# Patient Record
Sex: Male | Born: 1990 | Race: White | Hispanic: No | Marital: Single | Smoking: Never smoker
Health system: Southern US, Community
[De-identification: ages and names within clinical notes are randomized; demographics above are authoritative.]

## PROBLEM LIST (undated history)

## (undated) DIAGNOSIS — K219 Gastro-esophageal reflux disease without esophagitis: Secondary | ICD-10-CM

## (undated) HISTORY — DX: Gastro-esophageal reflux disease without esophagitis: K21.9

---
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
## 2012-01-06 ENCOUNTER — Ambulatory Visit (INDEPENDENT_AMBULATORY_CARE_PROVIDER_SITE_OTHER): Payer: PRIVATE HEALTH INSURANCE | Admitting: Physician Assistant

## 2012-01-06 VITALS — BP 94/60 | HR 63 | Temp 97.7°F | Resp 16 | Ht 69.0 in | Wt 156.0 lb

## 2012-01-06 DIAGNOSIS — Z23 Encounter for immunization: Secondary | ICD-10-CM

## 2012-01-06 NOTE — Progress Notes (Signed)
  Subjective:    Patient ID: MARTRELL EGUIA, male    DOB: 04/29/1991, 21 y.o.   MRN: [id number]  HPI 21 year old male presents for Tdap immunization.  He is transferring to [facility].  No known medical problems. He is up to date on all other immunizations and does not take any daily medications.     Review of Systems  All other systems reviewed and are negative.       Objective:   Physical Exam  Constitutional: He is oriented to person, place, and time. He appears well-developed and well-nourished.  HENT:  Head: Normocephalic and atraumatic.  Right Ear: External ear normal.  Left Ear: External ear normal.  Eyes: Conjunctivae are normal.  Neck: Normal range of motion.  Cardiovascular: Normal rate, regular rhythm and normal heart sounds.   Pulmonary/Chest: Effort normal and breath sounds normal.  Neurological: He is alert and oriented to person, place, and time.  Psychiatric: He has a normal mood and affect. His behavior is normal. Judgment and thought content normal.          Assessment & Plan:   1. Need for Tdap vaccination  Tdap vaccine greater than or equal to 7yo IM  Tdap given.  Forms completed Follow up as needed.

## 2012-06-13 ENCOUNTER — Telehealth: Payer: Self-pay

## 2012-06-13 NOTE — Telephone Encounter (Signed)
Patient had t-dap done in September. Faxed to [facility] with confirmation and faxed to his mother with confirmation (she is on [place]). Also faxed [employer] confirmation to her as well.

## 2012-06-13 NOTE — Telephone Encounter (Signed)
[facility] needs a copy of vaccines given on 01/06/12 T-Dap  To [phone]  Also fax to mom [phone]  At her desk   [employer]:  [id number]

## 2013-01-06 ENCOUNTER — Encounter (HOSPITAL_COMMUNITY): Payer: Self-pay | Admitting: *Deleted

## 2013-01-06 ENCOUNTER — Emergency Department (HOSPITAL_COMMUNITY)
Admission: EM | Admit: 2013-01-06 | Discharge: 2013-01-06 | Disposition: A | Discharge status: Home / Self Care | Payer: No Typology Code available for payment source | Attending: Emergency Medicine | Admitting: Emergency Medicine

## 2013-01-06 ENCOUNTER — Emergency Department (HOSPITAL_COMMUNITY): Payer: No Typology Code available for payment source

## 2013-01-06 DIAGNOSIS — R42 Dizziness and giddiness: Secondary | ICD-10-CM | POA: Insufficient documentation

## 2013-01-06 DIAGNOSIS — R0602 Shortness of breath: Secondary | ICD-10-CM | POA: Insufficient documentation

## 2013-01-06 DIAGNOSIS — Z880 Allergy status to penicillin: Secondary | ICD-10-CM | POA: Insufficient documentation

## 2013-01-06 DIAGNOSIS — R0789 Other chest pain: Secondary | ICD-10-CM | POA: Insufficient documentation

## 2013-01-06 DIAGNOSIS — F411 Generalized anxiety disorder: Secondary | ICD-10-CM | POA: Insufficient documentation

## 2013-01-06 LAB — POCT I-STAT TROPONIN I: Troponin i, poc: 0 ng/mL (ref 0.00–0.08)

## 2013-01-06 NOTE — ED Notes (Signed)
pts belongings placed in a belongings bag. Items include black sneakers denim shirt and jeans.

## 2013-01-06 NOTE — ED Notes (Signed)
3 weeks ago pt was worked up for chest pain/sob and told he had anxiety.   Today he is feeling the same - sob, L sided chest pain. Denies nausea, diaphoresis, but c/o "fast hr".  nsr presently.

## 2013-01-06 NOTE — ED Notes (Signed)
Pt had vagal episode with blood draw. Pt placed flat on strecther and cool cloth given. Pt diaphoretic, mother at bedside

## 2013-01-06 NOTE — ED Notes (Signed)
Pt feels better, skin is warm and dry. Iv infusing without problem.  MD aware

## 2013-01-06 NOTE — ED Provider Notes (Signed)
CSN: [id number]     Arrival date & time 01/06/13  1352 History   First MD Initiated Contact with Patient 01/06/13 1612     Chief Complaint  Patient presents with  . Chest Pain  . Anxiety   (Consider location/radiation/quality/duration/timing/severity/associated sxs/prior Treatment) HPI Complains of anterior chest pain described as "a stiffness "in his anterior chest, nonradiating onset at noon today accompanied by symptoms of lightheadedness and shortness of breath. No sweatiness no nausea. No other associated symptoms. Nothing makes symptoms better or worse however symptoms have improved spontaneously with time. Seen for chest pain approximately one month ago at an urgent care center as told he had anxiety attack. Discomfort is now minimal described as 2 on a scale of 1-10.  History reviewed. No pertinent past medical history. cardiac risk factors none History reviewed. No pertinent past surgical history. past medical history negative No family history on file. past surgical history is negative History  Substance Use Topics  . Smoking status: Never Smoker   . Smokeless tobacco: Not on file  . Alcohol Use: No    social history nonsmoker occasional on-call no illicit drug use Review of Systems  Constitutional: Negative.   HENT: Negative.   Respiratory: Positive for shortness of breath.   Cardiovascular: Positive for chest pain.  Gastrointestinal: Negative.   Musculoskeletal: Negative.   Skin: Negative.   Neurological: Positive for light-headedness.  Psychiatric/Behavioral: Negative.   All other systems reviewed and are negative.    Allergies  Penicillins and Codeine  Home Medications  No current outpatient prescriptions on file. BP 115/67  Pulse 61  Temp(Src) 98.1 F (36.7 C) (Oral)  Resp 21  Ht 5\' 8"  (1.727 m)  Wt 165 lb (74.844 kg)  BMI 25.09 kg/m2  SpO2 99% Physical Exam  Nursing note and vitals reviewed. Constitutional: He appears well-developed and  well-nourished.  HENT:  Head: Normocephalic and atraumatic.  Eyes: Conjunctivae are normal. Pupils are equal, round, and reactive to light.  Neck: Neck supple. No tracheal deviation present. No thyromegaly present.  Cardiovascular: Normal rate and regular rhythm.   No murmur heard. Pulmonary/Chest: Effort normal and breath sounds normal.  Abdominal: Soft. Bowel sounds are normal. He exhibits no distension. There is no tenderness.  Musculoskeletal: Normal range of motion. He exhibits no edema and no tenderness.  Neurological: He is alert. Coordination normal.  Skin: Skin is warm and dry. No rash noted.  Psychiatric: He has a normal mood and affect.    ED Course  Procedures (including critical care time) Labs Review Labs Reviewed - No data to display Imaging Review No results found.  Date: 01/06/2013  Rate: 65  Rhythm: normal sinus rhythm  QRS Axis: normal  Intervals: normal  ST/T Wave abnormalities: normal  Conduction Disutrbances: none  Narrative Interpretation: unremarkable  Patient had brief vasovagal episode while his blood was being drawn. Recovered spontaneously. 6:40 PM he is asymptomatic. Chest x-ray viewed by me. Troponin I equals 0. No results found for this or any previous visit. Dg Chest 2 View  01/06/2013   *RADIOLOGY REPORT*  Clinical Data: Chest pain.  CHEST - 2 VIEW  Comparison: No priors.  Findings: Lung volumes are normal.  No consolidative airspace disease.  No pleural effusions.  No pneumothorax.  No pulmonary nodule or mass noted.  Pulmonary vasculature and the cardiomediastinal silhouette are within normal limits.  IMPRESSION: 1. No radiographic evidence of acute cardiopulmonary disease.   Original Report Authenticated By: Trudie Reed, M.D.    MDM  No diagnosis  found. Strongly doubt acute coronary syndrome in this young otherwise healthy male with atypical symptoms normal EKG and negative troponin Plan referral wellness Center Diagnosis atypical  chest pain    Doug Sou, MD 01/06/13 1845

## 2013-02-26 ENCOUNTER — Ambulatory Visit (INDEPENDENT_AMBULATORY_CARE_PROVIDER_SITE_OTHER): Payer: PRIVATE HEALTH INSURANCE | Admitting: Family Medicine

## 2013-02-26 VITALS — BP 110/70 | HR 60 | Temp 98.4°F | Resp 16 | Ht 68.0 in | Wt 159.2 lb

## 2013-02-26 DIAGNOSIS — R002 Palpitations: Secondary | ICD-10-CM

## 2013-02-26 DIAGNOSIS — R0609 Other forms of dyspnea: Secondary | ICD-10-CM

## 2013-02-26 DIAGNOSIS — F411 Generalized anxiety disorder: Secondary | ICD-10-CM

## 2013-02-26 DIAGNOSIS — R0600 Dyspnea, unspecified: Secondary | ICD-10-CM

## 2013-02-26 LAB — POCT CBC
HCT, POC: 46.8 % (ref 43.5–53.7)
Hemoglobin: 15.5 g/dL (ref 14.1–18.1)
Lymph, poc: 1.9 (ref 0.6–3.4)
MCH, POC: 30.5 pg (ref 27–31.2)
MCV: 92.1 fL (ref 80–97)
MPV: 9.2 fL (ref 0–99.8)
POC MID %: 3.9 %M (ref 0–12)
RBC: 5.08 M/uL (ref 4.69–6.13)
WBC: 10.9 10*3/uL — AB (ref 4.6–10.2)

## 2013-02-26 MED ORDER — BUSPIRONE HCL 7.5 MG PO TABS: 7.5000 mg | ORAL_TABLET | Freq: Three times a day (TID) | ORAL | Status: DC

## 2013-02-26 MED ORDER — BUSPIRONE HCL 7.5 MG PO TABS: 7.5000 mg | ORAL_TABLET | Freq: Two times a day (BID) | ORAL | Status: DC

## 2013-02-26 NOTE — Patient Instructions (Signed)
Take the Buspar twice a day.  This is for anxiety and should help with any anxiety if your heart rate speeds up.  We are checking your thyroid, electrolytes, and kidney function as well as checking for anemia today.   We will also get you to see a cardiologist for a home heartrate monitor.

## 2013-02-26 NOTE — Progress Notes (Signed)
Ryan Dougherty is a 22 y.o. male who presents to Urgent Care today with complaints of palpitations:  1.  Palpitations:  Patient has had these sx's off and on since August.  He is in school at [employer] and started in August.  Describes associated symptoms of dyspnea, lightheadedness, occasional diaphoresis when they occur.  No chest pain.    Has never had palpitations previously.  At least 5-6 since August episodes that can last anywhere from 30 minutes to several hours. Has presented to ED in [place] and ED here at [facility].  Mom is present with patient and provides written records of these -- EKG showed NSR both times.  Troponin obtained at [facility] ED but no further labwork conducted.  Patient not having palpitations at time EKG performed.    Most recent episode last week while walking to school.  Unable to bring heartrate down with relaxation exercises.  Had feeling "something bad" would happen and left class.  Denies any EtOH, drug abuse, nicotine use.  Family history of "thryoid problems" in mom and maternal aunts  Notes some increased anxiety since starting back in school, mostly around schoolwork and occasionally in crowds.  Denies depressive symptoms.  Marland Kitchen    PMH reviewed.  No past medical history on file. No past surgical history on file.  Medications reviewed. No current outpatient prescriptions on file.   No current facility-administered medications for this visit.    ROS as above otherwise neg.    Physical Exam:  BP 110/70  Pulse 60  Temp(Src) 98.4 F (36.9 C) (Oral)  Resp 16  Ht 5\' 8"  (1.727 m)  Wt 159 lb 3.2 oz (72.213 kg)  BMI 24.21 kg/m2  SpO2 100% Gen:  Alert, cooperative patient who appears stated age in no acute distress.  Vital signs reviewed. HEENT: EOMI,  PERRL.  MMM Neck:  No thyromegaly.  Pulm:  Clear to auscultation bilaterally with good air movement.  No wheezes or rales noted.   Cardiac:  Regular rate and rhythm without murmur auscultated.  Good S1/S2. Abd:   Soft/nondistended/nontender.   Exts: Non edematous BL  LE, warm and well perfused.  Neuro:  No gross focal deficits noted Psych:  Not depressed or anxious appearing.  Linear and coherent thought process as evidenced by speech pattern. Smiles spontaneously.    Assessment and Plan:  1.  Palpitations: - sounds a bit like panic attack.  Trial of Buspar to see if any improvement.  - will obtain basic electrolytes, TSH, CBC for anemia - due to persistent and consistent symptoms, will refer to cards for Holter monitor - red flags provided to return regarding chest pain, persistent palpitations, dyspnea.

## 2013-02-27 LAB — COMPREHENSIVE METABOLIC PANEL
ALT: 16 U/L (ref 0–53)
CO2: 23 mEq/L (ref 19–32)
Calcium: 9.8 mg/dL (ref 8.4–10.5)
Chloride: 102 mEq/L (ref 96–112)
Creat: 0.92 mg/dL (ref 0.50–1.35)
Glucose, Bld: 81 mg/dL (ref 70–99)
Total Protein: 7.6 g/dL (ref 6.0–8.3)

## 2013-02-27 LAB — TSH: TSH: 0.872 u[IU]/mL (ref 0.350–4.500)

## 2013-02-27 LAB — T4, FREE: Free T4: 1.35 ng/dL (ref 0.80–1.80)

## 2013-02-28 ENCOUNTER — Encounter: Payer: Self-pay | Admitting: Family Medicine

## 2013-02-28 ENCOUNTER — Telehealth: Payer: Self-pay | Admitting: Family Medicine

## 2013-02-28 NOTE — Telephone Encounter (Signed)
Faxed note. Mother advised, have asked Lupita Leash about referral. This is being done now.

## 2013-02-28 NOTE — Telephone Encounter (Signed)
Called left message for call back

## 2013-02-28 NOTE — Telephone Encounter (Signed)
Pts mother called, this pt was seen Monday by dr Gwendolyn Grant for an illness, pt forgot to ask for a note for school. 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  If note can be provided, please fax to moms work fax [phone]

## 2013-02-28 NOTE — Telephone Encounter (Signed)
Would you guys mind calling to alert the patient and his mother that all of his labwork came back normal?  We were mostly looking at electrolytes, thyroid function, and anemia, and these were all great.  THey should be hearing back shortly about cardiology referral for his palpitations.  Thanks! Renold Don

## 2013-03-05 ENCOUNTER — Telehealth: Payer: Self-pay

## 2013-03-05 NOTE — Telephone Encounter (Signed)
Mother is calling about Cardiology referral. I have provided her the number for [facility], she will call, will you call also and check on this referral?

## 2013-03-05 NOTE — Telephone Encounter (Signed)
SARAH WOULD LIKE TO SPEAK WITH AMY REGARDING HER SON. PLEASE CALL [phone]

## 2013-03-15 ENCOUNTER — Ambulatory Visit (INDEPENDENT_AMBULATORY_CARE_PROVIDER_SITE_OTHER): Payer: No Typology Code available for payment source | Admitting: Internal Medicine

## 2013-03-15 ENCOUNTER — Encounter: Payer: Self-pay | Admitting: Internal Medicine

## 2013-03-15 VITALS — BP 112/68 | Ht 68.0 in | Wt 162.7 lb

## 2013-03-15 DIAGNOSIS — R5383 Other fatigue: Secondary | ICD-10-CM

## 2013-03-15 DIAGNOSIS — R5381 Other malaise: Secondary | ICD-10-CM

## 2013-03-15 DIAGNOSIS — R0600 Dyspnea, unspecified: Secondary | ICD-10-CM

## 2013-03-15 DIAGNOSIS — R0609 Other forms of dyspnea: Secondary | ICD-10-CM

## 2013-03-15 DIAGNOSIS — R002 Palpitations: Secondary | ICD-10-CM

## 2013-03-15 NOTE — Patient Instructions (Signed)
Your physician has requested that you have an echocardiogram. Echocardiography is a painless test that uses sound waves to create images of your heart. It provides your doctor with information about the size and shape of your heart and how well your heart's chambers and valves are working. This procedure takes approximately one hour. There are no restrictions for this procedure.  Your physician has recommended that you wear an event monitor. Event monitors are medical devices that record the heart's electrical activity. Doctors most often [place] these monitors to diagnose arrhythmias. Arrhythmias are problems with the speed or rhythm of the heartbeat. The monitor is a small, portable device. You can wear one while you do your normal daily activities. This is usually used to diagnose what is causing palpitations/syncope (passing out). You will wear this for 2 weeks.  Please make an appointment for the same day as your echocardiogram to have one of the nurse's fit you with this monitor.   Your physician recommends that you schedule a follow-up appointment after your test and after you wear the monitor.

## 2013-03-21 ENCOUNTER — Encounter: Payer: Self-pay | Admitting: Internal Medicine

## 2013-03-21 DIAGNOSIS — R0600 Dyspnea, unspecified: Secondary | ICD-10-CM | POA: Insufficient documentation

## 2013-03-21 DIAGNOSIS — R002 Palpitations: Secondary | ICD-10-CM | POA: Insufficient documentation

## 2013-03-21 DIAGNOSIS — R5383 Other fatigue: Secondary | ICD-10-CM | POA: Insufficient documentation

## 2013-03-21 NOTE — Progress Notes (Signed)
    OFFICE NOTE  Chief Complaint:  Palpitations, dyspnea  Primary Care Physician: No PCP Per Patient  HPI:  Ryan Dougherty is a pleasant 22 year old male, referred to me evaluation of palpitations and dyspnea. He has no significant past medical history concerning for coronary disease. He is currently a Consulting civil engineer at [employer]. and does report a significant amount of stress and anxiety with school. His palpitations seem to be temporally related with school activities are higher in stress. He was recently seen and prescribed a low dose he is prone to take as needed but has not really used it to see if he has any benefit. He reports episodes of abrupt onset tachypalpitations where his heart is racing that is associated with anxiety. It's difficult to tell which comes first. He denies any chest pain with this but it does cause some shortness of breath and fatigue. The episodes may happen on a weekly basis.  PMHx:  History reviewed. No pertinent past medical history.  History reviewed. No pertinent past surgical history.  FAMHx:  Family History  Problem Relation Age of Onset  . Heart attack Maternal Grandfather 79  . Hypertension Maternal Grandfather     SOCHx:   reports that he has never smoked. He has never used smokeless tobacco. He reports that he does not drink alcohol or use illicit drugs.  ALLERGIES:  Allergies  Allergen Reactions  . Penicillins Rash  . Codeine Rash    ROS: A comprehensive review of systems was negative except for: Respiratory: positive for dyspnea on exertion Cardiovascular: positive for fatigue and palpitations  HOME MEDS: No current outpatient prescriptions on file.   No current facility-administered medications for this visit.    LABS/IMAGING: No results found for this or any previous visit (from the past 48 hour(s)). No results found.  VITALS: BP 112/68  Ht 5\' 8"  (1.727 m)  Wt 162 lb 11.2 oz (73.8 kg)  BMI 24.74 kg/m2  EXAM: General  appearance: alert and no distress Neck: no carotid bruit and no JVD Lungs: clear to auscultation bilaterally Heart: regular rate and rhythm, S1, S2 normal, no murmur, click, rub or gallop Abdomen: soft, non-tender; bowel sounds normal; no masses,  no organomegaly Extremities: extremities normal, atraumatic, no cyanosis or edema Pulses: 2+ and symmetric Skin: Skin color, texture, turgor normal. No rashes or lesions Neurologic: Grossly normal Psych: Mood, affect normal  EKG: Normal sinus rhythm with sinus arrhythmia at 76, normal axes and intervals, no evidence for pre-excitation  ASSESSMENT: 1. Palpitations 2. Anxiety  PLAN: 1.   Ryan Dougherty is having palpitations and anxiety. The palpitations may be due to anxiety or causing it. I do agree with a trial of buspirone, Xanax or similar short acting anxiolytic to see if this helps with his palpitations. We'll also go ahead and place him on a monitor and check an echocardiogram to see if there any structural abnormalities that may be leading to his shortness of breath and palpitations. I do see him back to review the results of these tests in a few weeks.  Thanks again for the kind referral.  Ryan Nose, MD, [facility] Attending Cardiologist [facility]  Ryan Dougherty 03/21/2013, 8:33 AM

## 2013-03-23 ENCOUNTER — Telehealth: Payer: Self-pay | Admitting: Internal Medicine

## 2013-03-23 NOTE — Telephone Encounter (Signed)
Returned patient's mother's phone call - informed [employer] was called and monitor is to be arriving to patient on Monday 11/24.

## 2013-03-23 NOTE — Telephone Encounter (Signed)
Message forwarded to J. Elkins, RN.  

## 2013-03-23 NOTE — Telephone Encounter (Signed)
Calling about a heart monitor . Was told that we would be mailing one to her son and he has not receive it as of yet .Marland Kitchen Please call    Thanks

## 2013-04-04 ENCOUNTER — Ambulatory Visit (HOSPITAL_COMMUNITY): Payer: No Typology Code available for payment source

## 2013-04-04 ENCOUNTER — Telehealth: Payer: Self-pay | Admitting: Internal Medicine

## 2013-04-04 NOTE — Telephone Encounter (Signed)
Information given to mother, relay information to patient-- called [employer]  If any episodes occur.  She verbalized understanding. If symptoms be come worse go to ER

## 2013-04-04 NOTE — Telephone Encounter (Signed)
Mother states the patient had an episode earlier -she states she noticed that he was pale in color "white" ,shaky ,she states her son told her that his heart was racing and very tired. Per mother ,lasting for@ 30 mins. She states he was riding in the car with her. He is not having symptoms now,he is at school doing an exam.  She wanted to know if monitor picked up anything.  Informed mother that [employer] had not posted anything as of yet but will call the company. RN ASKED MOTHER IF PATIENT RECORDED ANY OF THE EPISODE . She did not know.   Called [employer] -spoke to [employer] (rep) she stated that patient had not called in any episodes,only has reading from 04/01/13.  Reviewed with Nada Boozer NP-  She saw report from 04/01/13 (artifact and sinus tachy -114 bpm)- inform  Patient to continue to montior and contact [employer]

## 2013-04-04 NOTE — Telephone Encounter (Signed)
Son had another episode today with heart rate getting high , turning kinda white and complained of sob . Currently wearing heart monitor.  Please call

## 2013-04-04 NOTE — Telephone Encounter (Signed)
left message for mother to call back

## 2013-04-18 ENCOUNTER — Ambulatory Visit (HOSPITAL_COMMUNITY)
Admission: RE | Admit: 2013-04-18 | Discharge: 2013-04-18 | Disposition: A | Discharge status: Home / Self Care | Payer: No Typology Code available for payment source | Source: Ambulatory Visit | Attending: Internal Medicine | Admitting: Internal Medicine

## 2013-04-18 DIAGNOSIS — R0602 Shortness of breath: Secondary | ICD-10-CM

## 2013-04-18 DIAGNOSIS — R0989 Other specified symptoms and signs involving the circulatory and respiratory systems: Secondary | ICD-10-CM | POA: Insufficient documentation

## 2013-04-18 DIAGNOSIS — R0600 Dyspnea, unspecified: Secondary | ICD-10-CM

## 2013-04-18 DIAGNOSIS — R002 Palpitations: Secondary | ICD-10-CM

## 2013-04-18 DIAGNOSIS — R0609 Other forms of dyspnea: Secondary | ICD-10-CM | POA: Insufficient documentation

## 2013-04-18 NOTE — Progress Notes (Signed)
2D Echo Performed 04/18/2013    Garion Wempe, RCS  

## 2013-05-24 ENCOUNTER — Other Ambulatory Visit: Payer: Self-pay | Admitting: *Deleted

## 2013-05-24 DIAGNOSIS — R002 Palpitations: Secondary | ICD-10-CM

## 2013-07-09 ENCOUNTER — Encounter: Payer: Self-pay | Admitting: *Deleted

## 2013-09-27 ENCOUNTER — Ambulatory Visit: Payer: Managed Care, Other (non HMO) | Admitting: Family Medicine

## 2013-09-27 VITALS — BP 110/62 | HR 84 | Temp 98.6°F | Resp 18 | Ht 68.5 in | Wt 153.0 lb

## 2013-09-27 DIAGNOSIS — F411 Generalized anxiety disorder: Secondary | ICD-10-CM

## 2013-09-27 MED ORDER — CLONAZEPAM 0.5 MG PO TABS: 0.5000 mg | ORAL_TABLET | Freq: Two times a day (BID) | ORAL | Status: DC | PRN

## 2013-09-27 MED ORDER — FLUOXETINE HCL 20 MG PO TABS: 20.0000 mg | ORAL_TABLET | Freq: Every day | ORAL | Status: DC

## 2013-09-27 NOTE — Progress Notes (Signed)
[facility] [address], [place] [address] [phone]  Date:  09/27/2013   Name:  Ryan PiggJason T Dantuono   DOB:  October 16, 1990   MRN:  [id number]  PCP:  No PCP Per Patient    Chief Complaint: dry mouth, Heartburn, Weight Loss, Palpitations, Tinnitus and Fatigue   History of Present Illness:  Ryan PiggJason T Stave is a 23 y.o. very pleasant male patient who presents with the following:  Seen here in the fall for palpations, then seen by cardiology.  Thought to be related to anxiety.  He had a normal echo in December 2014.  He is here today with "different symptoms that sort of come and go."  He has noted these sx for the last few weeks.  He has noted a dry feeling in his throat and "mild difficulty swallowing."  He notes left ear tinnitus and a "static sound" at times.  He does continue to have heart palpations.  He has felt nauseated in the morning, less appetite.  He is aware that he has lost some weight.  He has noted some sneezing, but not that much.  The left ear may "feel like pressure but does not hurt."   Wt Readings from Last 3 Encounters:  09/27/13 153 lb (69.4 kg)  03/15/13 162 lb 11.2 oz (73.8 kg)  02/26/13 159 lb 3.2 oz (72.213 kg)   He has not actually vomited He does feel that some of this is probably anxiety related.  He feels that anxiety is with him about 90% of the time.  He always feels that he is aware of his heart beating.  Notes that he will notice probably benign symptoms and become very concerned that he has cancer, TB or another serious disease.  He does not feel depressed- just feels flat, "nothing."  He does note some anhedonia.  Energy level will "come and go."  He has noted some fatigue and pain in his shoulder and neck. .   He had some buspar from last year that he started taking- however so far he has not noted a lot of improvement.  He just started using this again over the last few days.   He does not have any SI.  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    Patient Active Problem List   Diagnosis Date Noted  . Palpitations 03/21/2013  . Fatigue 03/21/2013  . Dyspnea 03/21/2013    History reviewed. No pertinent past medical history.  History reviewed. No pertinent past surgical history.  History  Substance Use Topics  . Smoking status: Never Smoker   . Smokeless tobacco: Never Used  . Alcohol Use: No    Family History  Problem Relation Age of Onset  . Heart attack Maternal Grandfather 79  . Hypertension Maternal Grandfather     Allergies  Allergen Reactions  . Penicillins Rash  . Codeine Rash    Medication list has been reviewed and updated.  No current outpatient prescriptions on file prior to visit.   No current facility-administered medications on file prior to visit.    Review of Systems:  As per HPI- otherwise negative.   Physical Examination: Filed Vitals:   09/27/13 1542  BP: 110/62  Pulse: 84  Temp: 98.6 F (37 C)  Resp: 18   Filed Vitals:   09/27/13 1542  Height: 5' 8.5" (1.74 m)  Weight: 153 lb (69.4 kg)   Body mass index is 22.92 kg/(m^2). Ideal  Body Weight: Weight in (lb) to have BMI = 25: 166.5  GEN: WDWN, NAD, Non-toxic, A & O x 3, looks well, appears anxious HEENT: Atraumatic, Normocephalic. Neck supple. No masses, No LAD.  Bilateral TM wnl, oropharynx normal.  PEERL,EOMI.   Ears and Nose: No external deformity. CV: RRR, No M/G/R. No JVD. No thrill. No extra heart sounds. PULM: CTA B, no wheezes, crackles, rhonchi. No retractions. No resp. distress. No accessory muscle use. ABD: S, NT, ND, +BS. No rebound. No HSM. EXTR: No c/c/e NEURO Normal gait.  PSYCH: Normally interactive. Conversant.    Assessment and Plan: GAD (generalized anxiety disorder) - Plan: FLUoxetine (PROZAC) 20 MG tablet, clonazePAM (KLONOPIN) 0.5 MG tablet  Suspect that his sx are due to anxiety- he also agrees that this is likely to be true. He had a full lab panel in  October- I am glad to do this again but it is likely not necessary. He is comfortable deferring labs for now.  Will stop the buspar, start prozac 20mg  and also prn klonpin (for a short period).  He will see me again in about 1 month.  Encouraged a healthy diet, exercise and fun activities with friends.  If he continues to have trouble we can certainly do labs at his recheck appt in a month.   Signed Abbe Amsterdam, MD

## 2013-09-27 NOTE — Patient Instructions (Signed)
Stop using the buspar for the time being. Start taking prozac 20 mg once a day- you can increase to 40 mg after about 2 weeks.  Use the klonopin as needed for your anxiety- take a tablet twice a day as needed. Remember this can make you sleepy, and can also be habit forming.  Our goal is not to have you on this long term. Please come and see me in about one month to recheck.

## 2014-01-14 ENCOUNTER — Other Ambulatory Visit: Payer: Self-pay | Admitting: Family Medicine

## 2014-01-14 DIAGNOSIS — F411 Generalized anxiety disorder: Secondary | ICD-10-CM

## 2014-01-15 NOTE — Telephone Encounter (Signed)
Received RF request and did give him #30 more.  However he was supposed to follow-up with me in a month - called and LMOM asking him to please come and see me soon

## 2014-09-02 ENCOUNTER — Ambulatory Visit (INDEPENDENT_AMBULATORY_CARE_PROVIDER_SITE_OTHER): Payer: BLUE CROSS/BLUE SHIELD | Admitting: Family Medicine

## 2014-09-02 VITALS — BP 112/66 | HR 74 | Temp 98.6°F | Resp 16 | Ht 69.5 in | Wt 164.0 lb

## 2014-09-02 DIAGNOSIS — Z711 Person with feared health complaint in whom no diagnosis is made: Secondary | ICD-10-CM

## 2014-09-02 DIAGNOSIS — F411 Generalized anxiety disorder: Secondary | ICD-10-CM

## 2014-09-02 MED ORDER — PAROXETINE HCL 20 MG PO TABS: 20.0000 mg | ORAL_TABLET | Freq: Every day | ORAL | Status: DC

## 2014-09-02 NOTE — Patient Instructions (Signed)
I agree that your heart symptoms are most likely related to anxiety.  At your age and with recent normal echocardiogram any soret of heart valve problem is very unlikely Start on the paxil 20 mg for anxiety; you can increase to 2 pills a day after 2 weeks.    Please call or email with an update in 3-4 weeks Let me know if you have any worsening or change in your symptoms

## 2014-09-02 NOTE — Progress Notes (Signed)
[facility] [address], [place] [id number] [phone]  Date:  09/02/2014   Name:  Ryan PiggJason T Altadonna   DOB:  17-Jul-1990   MRN:  [id number]  PCP:  No PCP Per Patient    Chief Complaint: sounds coming from chest   History of Present Illness:  Ryan Dougherty is a 24 y.o. very pleasant male patient who presents with the following:  He is here today with a concern about something "clickin"g in his heart.  He saw cardiology in the past for palpitations and had a normal echo in 04/2013 as well as EKG and event monitor He has exercised regularly in the past- tried to start back about one month ago and noted a "panting" feeling in his chest.  He cannot describe it further than this.  He did not have any chest pain No syncope.   He is not aware of any family history of heart trouble.   He is a never smoker. He feels like generally his anxiety not terrible right now- he is not on any medications for same.   He may have some trouble getting to sleep sometimes.  Also he admits that he does tend to worry a lot about his health and about things that he thinks are likely not a problem but he cannot help worrying about them.  prozac did not help him much- he did well with klonpin but does not wish to be on long term benzos   Patient Active Problem List   Diagnosis Date Noted  . Palpitations 03/21/2013  . Fatigue 03/21/2013  . Dyspnea 03/21/2013    History reviewed. No pertinent past medical history.  History reviewed. No pertinent past surgical history.  History  Substance Use Topics  . Smoking status: Never Smoker   . Smokeless tobacco: Never Used  . Alcohol Use: No    Family History  Problem Relation Age of Onset  . Heart attack Maternal Grandfather 79  . Hypertension Maternal Grandfather     Allergies  Allergen Reactions  . Penicillins Rash  . Codeine Rash    Medication list has been reviewed and updated.  Current Outpatient Prescriptions on File  Prior to Visit  Medication Sig Dispense Refill  . busPIRone (BUSPAR) 7.5 MG tablet Take 7.5 mg by mouth daily.    . clonazePAM (KLONOPIN) 0.5 MG tablet TAKE 1 TABLET BY MOUTH TWICE DAILY AS NEEDED FOR ANXIETY (Patient not taking: Reported on 09/02/2014) 30 tablet 0  . FLUoxetine (PROZAC) 20 MG tablet Take 1 tablet (20 mg total) by mouth daily. May increase to 2 pills after 2 weeks (Patient not taking: Reported on 09/02/2014) 60 tablet 3   No current facility-administered medications on file prior to visit.    Review of Systems:  As per HPI- otherwise negative.   Physical Examination: Filed Vitals:   09/02/14 1437  BP: 112/66  Pulse: 74  Temp: 98.6 F (37 C)  Resp: 16   Filed Vitals:   09/02/14 1437  Height: 5' 9.5" (1.765 m)  Weight: 164 lb (74.39 kg)   Body mass index is 23.88 kg/(m^2). Ideal Body Weight: Weight in (lb) to have BMI = 25: 171.4  GEN: WDWN, NAD, Non-toxic, A & O x 3,looks well, a bit anxious  HEENT: Atraumatic, Normocephalic. Neck supple. No masses, No LAD. Ears and Nose: No external deformity. CV: RRR, No M/G/R. No JVD. No thrill. No extra heart sounds.  Normal cardiac exam sitting and supine PULM: CTA B,  no wheezes, crackles, rhonchi. No retractions. No resp. distress. No accessory muscle use. EXTR: No c/c/e NEURO Normal gait.  PSYCH: Normally interactive. Conversant. Not depressed or anxious appearing.  Calm demeanor.    Assessment and Plan: GAD (generalized anxiety disorder) - Plan: PARoxetine (PAXIL) 20 MG tablet  Concern about heart disease without diagnosis  Pt has insight that anxiety is probably triggering his health concerns.  He feels reassured about his heart and would like to try medication for anxiety again.  Will try paxil at 20 mg, increase to 40 mg in a couple of weeks.  He will update me in 3-4 weeks regarding his progress- Sooner if worse.     Signed Abbe Amsterdam, MD

## 2014-11-02 ENCOUNTER — Other Ambulatory Visit: Payer: Self-pay | Admitting: Family Medicine

## 2014-11-04 NOTE — Telephone Encounter (Signed)
Called and LMOM- I would like to ask him about this, he has not been on klonopin in some time.  Will try him back tomorrow

## 2014-11-05 NOTE — Telephone Encounter (Signed)
Called him back- he reports that over the last month he has had more trouble with anxiety and

## 2014-11-07 ENCOUNTER — Other Ambulatory Visit: Payer: Self-pay | Admitting: Family Medicine

## 2014-11-07 NOTE — Telephone Encounter (Signed)
Called him again- we have tried to talk on the phone a few times but had terrible connection and could not have a conversation.  I have tried to call him back but have not had success LMOM that I will refill his klonopin, but please let me know how he is doing.  ?is there an alternative number I can call

## 2014-11-07 NOTE — Telephone Encounter (Signed)
Pharm sent another req to RF clonazepam. Dr Patsy Lageropland, it looks like you spoke to pt but didn't finish your note below or send in RF. Do you want to RF?

## 2015-01-27 ENCOUNTER — Ambulatory Visit (INDEPENDENT_AMBULATORY_CARE_PROVIDER_SITE_OTHER): Payer: Commercial Managed Care - HMO | Admitting: Family Medicine

## 2015-01-27 ENCOUNTER — Ambulatory Visit (INDEPENDENT_AMBULATORY_CARE_PROVIDER_SITE_OTHER): Payer: Commercial Managed Care - HMO

## 2015-01-27 VITALS — BP 114/94 | HR 80 | Temp 98.6°F | Resp 16 | Ht 69.5 in | Wt 160.0 lb

## 2015-01-27 DIAGNOSIS — K2100 Gastro-esophageal reflux disease with esophagitis, without bleeding: Secondary | ICD-10-CM

## 2015-01-27 DIAGNOSIS — R0789 Other chest pain: Secondary | ICD-10-CM | POA: Diagnosis not present

## 2015-01-27 LAB — POCT CBC
GRANULOCYTE PERCENT: 71.5 % (ref 37–80)
HEMATOCRIT: 47.9 % (ref 43.5–53.7)
HEMOGLOBIN: 15.1 g/dL (ref 14.1–18.1)
Lymph, poc: 1.6 (ref 0.6–3.4)
MCH: 27.3 pg (ref 27–31.2)
MCHC: 31.6 g/dL — AB (ref 31.8–35.4)
MCV: 86.5 fL (ref 80–97)
MID (cbc): 0.5 (ref 0–0.9)
MPV: 6.6 fL (ref 0–99.8)
POC GRANULOCYTE: 5.4 (ref 2–6.9)
POC LYMPH PERCENT: 21.4 %L (ref 10–50)
POC MID %: 7.1 %M (ref 0–12)
Platelet Count, POC: 340 10*3/uL (ref 142–424)
RBC: 5.53 M/uL (ref 4.69–6.13)
RDW, POC: 13.5 %
WBC: 7.6 10*3/uL (ref 4.6–10.2)

## 2015-01-27 MED ORDER — RANITIDINE HCL 150 MG PO TABS: ORAL_TABLET | ORAL | Status: DC

## 2015-01-27 MED ORDER — OMEPRAZOLE 20 MG PO CPDR: DELAYED_RELEASE_CAPSULE | ORAL | Status: DC

## 2015-01-27 NOTE — Progress Notes (Signed)
Discussed the patient's symptoms with PA Sherril Croon.  Chest x-ray reviewed and interpreted. It appears normal. The EKG had less than 1 mm ST segment elevation in V2, but was otherwise normal also. Treatment plan for reflux discussed.  Peyton Najjar M.D.

## 2015-01-27 NOTE — Progress Notes (Signed)
01/27/2015 at 6:44 PM  Ryan Dougherty / DOB: [birth year] / MRN: [id number]  The patient has Palpitations; Fatigue; and Dyspnea on his problem list.  SUBJECTIVE  Ryan Dougherty is a 24 y.o. well appearing male presenting for the chief complaint of GERD like symptoms that have been present for over a year, but suddenly worse over the last 2 weeks.  Reports he has been waking up with burning in his chest and a sour taste in his mouth and also complains of losing his voice in the morning.  He has tried nothing for this problem.   Complains of some right sided chest pain and SOB in the morning.  Denies and exertional component.  Denies nausea, presyncope and a radicular pattern to the pain.     He  has no past medical history on file.    Medications reviewed and updated by myself where necessary, and exist elsewhere in the encounter.   Ryan Dougherty is allergic to penicillins and codeine. He  reports that he has never smoked. He has never used smokeless tobacco. He reports that he does not drink alcohol or use illicit drugs. He  has no sexual activity history on file. The patient  has no past surgical history on file.  His family history includes Heart attack (age of onset: 92) in his maternal grandfather; Hypertension in his maternal grandfather.  Review of Systems  Constitutional: Negative for fever and chills.  Respiratory: Negative for shortness of breath.   Cardiovascular: Positive for chest pain. Negative for palpitations and leg swelling.  Gastrointestinal: Positive for heartburn. Negative for nausea and abdominal pain.  Genitourinary: Negative.   Skin: Negative for rash.  Neurological: Negative for dizziness and headaches.    OBJECTIVE  His  height is 5' 9.5" (1.765 m) and weight is 160 lb (72.576 kg). His oral temperature is 98.6 F (37 C). His blood pressure is 114/94 and his pulse is 80. His respiration is 16 and oxygen saturation is 98%.  The patient's body mass index is  23.3 kg/(m^2).  Physical Exam  Vitals reviewed. Constitutional: He is oriented to person, place, and time. He appears well-developed. No distress.  Eyes: EOM are normal. Pupils are equal, round, and reactive to light. No scleral icterus.  Neck: Normal range of motion.  Cardiovascular: Normal rate and regular rhythm.   Respiratory: Effort normal and breath sounds normal.  GI: Soft. Bowel sounds are normal. He exhibits no distension and no mass. There is no tenderness. There is no rebound.  Musculoskeletal: Normal range of motion.  Neurological: He is alert and oriented to person, place, and time. No cranial nerve deficit.  Skin: Skin is warm and dry. No rash noted. He is not diaphoretic.  Psychiatric: He has a normal mood and affect.    [facility] reading (PRIMARY) by  Dr. Alwyn Ren: Negative for cardiopulmonary disease  EKG: NSR, normal axis, negative for hypertropy. < 1 mm st segment elevation in V2.   Results for orders placed or performed in visit on 01/27/15 (from the past 24 hour(s))  POCT CBC     Status: Abnormal   Collection Time: 01/27/15  6:41 PM  Result Value Ref Range   WBC 7.6 4.6 - 10.2 K/uL   Lymph, poc 1.6 0.6 - 3.4   POC LYMPH PERCENT 21.4 10 - 50 %L   MID (cbc) 0.5 0 - 0.9   POC MID % 7.1 0 - 12 %M   POC Granulocyte 5.4 2 - 6.9  Granulocyte percent 71.5 37 - 80 %G   RBC 5.53 4.69 - 6.13 M/uL   Hemoglobin 15.1 14.1 - 18.1 g/dL   HCT, POC 16.1 09.6 - 53.7 %   MCV 86.5 80 - 97 fL   MCH, POC 27.3 27 - 31.2 pg   MCHC 31.6 (A) 31.8 - 35.4 g/dL   RDW, POC 04.5 %   Platelet Count, POC 340 142 - 424 K/uL   MPV 6.6 0 - 99.8 fL    ASSESSMENT & PLAN  Aldahir was seen today for gastrophageal reflux and depression.  Diagnoses and all orders for this visit:  Gastroesophageal reflux disease with esophagitis: Negative for anemia.  Will hold on work up of gastric ulcer.   -     POCT CBC  Atypical chest pain: Work up reassuring thus far.  Most likely 2/2 problem 1.  -     EKG  12-Lead -     DG Chest 2 View; Future -     COMPLETE METABOLIC PANEL WITH GFR    The patient was advised to call or come back to clinic if he does not see an improvement in symptoms, or worsens with the above plan.   Deliah Boston, MHS, PA-C [facility] 01/27/2015 6:44 PM

## 2015-01-28 LAB — COMPLETE METABOLIC PANEL WITH GFR
ALBUMIN: 5.3 g/dL — AB (ref 3.6–5.1)
ALK PHOS: 79 U/L (ref 40–115)
ALT: 18 U/L (ref 9–46)
AST: 20 U/L (ref 10–40)
BUN: 10 mg/dL (ref 7–25)
CALCIUM: 10.1 mg/dL (ref 8.6–10.3)
CHLORIDE: 102 mmol/L (ref 98–110)
CO2: 25 mmol/L (ref 20–31)
Creat: 0.81 mg/dL (ref 0.60–1.35)
Glucose, Bld: 99 mg/dL (ref 65–99)
POTASSIUM: 4.1 mmol/L (ref 3.5–5.3)
Sodium: 138 mmol/L (ref 135–146)
Total Bilirubin: 0.9 mg/dL (ref 0.2–1.2)
Total Protein: 7.9 g/dL (ref 6.1–8.1)

## 2015-02-05 ENCOUNTER — Emergency Department (HOSPITAL_COMMUNITY)
Admission: EM | Admit: 2015-02-05 | Discharge: 2015-02-05 | Disposition: A | Discharge status: Home / Self Care | Payer: Commercial Managed Care - HMO | Source: Home / Self Care | Attending: Family Medicine | Admitting: Family Medicine

## 2015-02-05 ENCOUNTER — Encounter (HOSPITAL_COMMUNITY): Payer: Self-pay | Admitting: *Deleted

## 2015-02-05 DIAGNOSIS — K5901 Slow transit constipation: Secondary | ICD-10-CM | POA: Diagnosis not present

## 2015-02-05 MED ORDER — PEG 3350-KCL-NA BICARB-NACL 420 G PO SOLR: 4000.0000 mL | Freq: Once | ORAL | Status: DC

## 2015-02-05 NOTE — ED Provider Notes (Signed)
CSN: [id number]     Arrival date & time 02/05/15  1448 History   First MD Initiated Contact with Patient 02/05/15 1502     Chief Complaint  Patient presents with  . Abdominal Pain   (Consider location/radiation/quality/duration/timing/severity/associated sxs/prior Treatment) Patient is a 24 y.o. male presenting with cramps. The history is provided by the patient and the spouse.  Abdominal Cramping This is a new problem. Episode onset: no bm x 3 wks. Associated symptoms include abdominal pain and shortness of breath. Pertinent negatives include no chest pain.    History reviewed. No pertinent past medical history. History reviewed. No pertinent past surgical history. Family History  Problem Relation Age of Onset  . Heart attack Maternal Grandfather 79  . Hypertension Maternal Grandfather    Social History  Substance Use Topics  . Smoking status: Never Smoker   . Smokeless tobacco: Never Used  . Alcohol Use: No    Review of Systems  Constitutional: Negative.   Respiratory: Positive for shortness of breath.   Cardiovascular: Negative.  Negative for chest pain.  Gastrointestinal: Positive for abdominal pain and constipation. Negative for nausea, vomiting, diarrhea and blood in stool.  All other systems reviewed and are negative.   Allergies  Penicillins and Codeine  Home Medications   Prior to Admission medications   Medication Sig Start Date End Date Taking? Authorizing Provider  clonazePAM (KLONOPIN) 0.5 MG tablet TAKE 1 TABLET BY MOUTH TWICE DAILY AS NEEDED FOR ANXIETY 11/07/14   Gwenlyn Found Copland, MD  omeprazole (PRILOSEC) 20 MG capsule Take in the morning thirty minutes before eating.  Do not miss doses. 01/27/15   Ofilia Neas, PA-C  polyethylene glycol-electrolytes (NULYTELY/GOLYTELY) 420 G solution Take 4,000 mLs by mouth once. 02/05/15   Linna Hoff, MD  ranitidine (ZANTAC) 150 MG tablet Take for break through GERD. 01/27/15   Ofilia Neas, PA-C   Meds Ordered  and Administered this Visit  Medications - No data to display  BP 120/81 mmHg  Pulse 72  Temp(Src) 98.2 F (36.8 C) (Oral)  Resp 14  SpO2 100% No data found.   Physical Exam  Constitutional: He appears well-developed and well-nourished. No distress.  Cardiovascular: Normal rate, normal heart sounds and intact distal pulses.   Pulmonary/Chest: Effort normal and breath sounds normal.  Abdominal: He exhibits no distension and no mass. There is no tenderness. There is no rebound and no guarding.  Skin: Skin is warm and dry.  Psychiatric: His mood appears anxious.  Nursing note and vitals reviewed.   ED Course  Procedures (including critical care time)  Labs Review Labs Reviewed - No data to display  Imaging Review No results found.   Visual Acuity Review  Right Eye Distance:   Left Eye Distance:   Bilateral Distance:    Right Eye Near:   Left Eye Near:    Bilateral Near:         MDM   1. Slow transit constipation        Linna Hoff, MD 02/05/15 1529

## 2015-02-05 NOTE — ED Notes (Signed)
Pt  Reports            abd  Pain  As  Well  As   Constipation      No  Bowel  Movement  For    1  Week  unreleived  By  Laxatives             Scheduled  For  Sigmoid      02/06/2015

## 2015-05-22 ENCOUNTER — Other Ambulatory Visit: Payer: Self-pay | Admitting: Physician Assistant

## 2015-09-08 ENCOUNTER — Ambulatory Visit (INDEPENDENT_AMBULATORY_CARE_PROVIDER_SITE_OTHER): Payer: BLUE CROSS/BLUE SHIELD | Admitting: Physician Assistant

## 2015-09-08 VITALS — BP 120/74 | HR 77 | Temp 99.2°F | Resp 18 | Ht 69.0 in | Wt 168.0 lb

## 2015-09-08 DIAGNOSIS — R590 Localized enlarged lymph nodes: Secondary | ICD-10-CM

## 2015-09-08 LAB — C-REACTIVE PROTEIN

## 2015-09-08 LAB — COMPLETE METABOLIC PANEL WITH GFR
ALBUMIN: 4.9 g/dL (ref 3.6–5.1)
ALK PHOS: 75 U/L (ref 40–115)
ALT: 23 U/L (ref 9–46)
AST: 24 U/L (ref 10–40)
BUN: 13 mg/dL (ref 7–25)
CALCIUM: 9.6 mg/dL (ref 8.6–10.3)
CHLORIDE: 101 mmol/L (ref 98–110)
CO2: 26 mmol/L (ref 20–31)
CREATININE: 0.81 mg/dL (ref 0.60–1.35)
GFR, Est Non African American: >89 mL/min (ref >=60)
Glucose, Bld: 89 mg/dL (ref 65–99)
POTASSIUM: 3.9 mmol/L (ref 3.5–5.3)
Sodium: 137 mmol/L (ref 135–146)
Total Bilirubin: 0.8 mg/dL (ref 0.2–1.2)
Total Protein: 7.4 g/dL (ref 6.1–8.1)

## 2015-09-08 LAB — POCT CBC
Granulocyte percent: 70.3 %G (ref 37–80)
HCT, POC: 43.7 % (ref 43.5–53.7)
Hemoglobin: 15.7 g/dL (ref 14.1–18.1)
LYMPH, POC: 1.6 (ref 0.6–3.4)
MCH, POC: 31 pg (ref 27–31.2)
MCHC: 36 g/dL — AB (ref 31.8–35.4)
MCV: 86.2 fL (ref 80–97)
MID (CBC): 0.4 (ref 0–0.9)
MPV: 7 fL (ref 0–99.8)
PLATELET COUNT, POC: 252 10*3/uL (ref 142–424)
POC Granulocyte: 4.9 (ref 2–6.9)
POC LYMPH PERCENT: 23.6 %L (ref 10–50)
POC MID %: 6.1 % (ref 0–12)
RBC: 5.07 M/uL (ref 4.69–6.13)
RDW, POC: 13 %
WBC: 6.9 10*3/uL (ref 4.6–10.2)

## 2015-09-08 LAB — POCT SEDIMENTATION RATE: POCT SED RATE: 8 mm/hr (ref 0–22)

## 2015-09-08 LAB — POCT RAPID STREP A (OFFICE): RAPID STREP A SCREEN: NEGATIVE

## 2015-09-08 LAB — HIV ANTIBODY (ROUTINE TESTING W REFLEX): HIV 1&2 Ab, 4th Generation: NONREACTIVE

## 2015-09-08 MED ORDER — DOXYCYCLINE HYCLATE 100 MG PO CAPS: 100.0000 mg | ORAL_CAPSULE | Freq: Two times a day (BID) | ORAL | Status: AC

## 2015-09-08 NOTE — Progress Notes (Signed)
[facility] [address], [place] [address] [phone]  Date:  09/08/2015   Name:  Ryan Dougherty   DOB:  12-02-1990   MRN:  [id number]  PCP:  No PCP Per Patient    History of Present Illness:  Ryan Dougherty is a 25 y.o. male patient who presents to [facility] for neck swellings.     Neck swelling 1.5 weeks ago.  Shoulder ache along the left side of his neck, then noticed on the right side of his neck.  No fever.  Back pain.  2 weeks ago he was clearing a plot, though he does not recall having significant painnear that.  No trauma.  No known tick bite.  Possible nausea.  2-3 years ago.  No upper respiratory symptoms of congestion, cough, or sore throat.   [employer] studying with Shevelle Smither   Patient Active Problem List   Diagnosis Date Noted  . Palpitations 03/21/2013  . Fatigue 03/21/2013  . Dyspnea 03/21/2013    History reviewed. No pertinent past medical history.  History reviewed. No pertinent past surgical history.  Social History  Substance Use Topics  . Smoking status: Never Smoker   . Smokeless tobacco: Never Used  . Alcohol Use: No    Family History  Problem Relation Age of Onset  . Heart attack Maternal Grandfather 79  . Hypertension Maternal Grandfather     Allergies  Allergen Reactions  . Penicillins Rash  . Codeine Rash    Medication list has been reviewed and updated.  Current Outpatient Prescriptions on File Prior to Visit  Medication Sig Dispense Refill  . clonazePAM (KLONOPIN) 0.5 MG tablet TAKE 1 TABLET BY MOUTH TWICE DAILY AS NEEDED FOR ANXIETY (Patient not taking: Reported on 09/08/2015) 30 tablet 0  . omeprazole (PRILOSEC) 20 MG capsule TAKE 1 CAPSULE BY MOUTH EVERY MORNING 3O MINUTES BEFORE EATING(DO NOT MISS DOSES) (Patient not taking: Reported on 09/08/2015) 90 capsule 0  . polyethylene glycol-electrolytes (NULYTELY/GOLYTELY) 420 G solution Take 4,000 mLs by mouth once. (Patient not taking: Reported on 09/08/2015) 4000 mL 0  .  ranitidine (ZANTAC) 150 MG tablet Take for break through GERD. (Patient not taking: Reported on 09/08/2015) 60 tablet 0   No current facility-administered medications on file prior to visit.    ROS ROS otherwise unremarkable unless listed above.   Physical Examination: BP 120/74 mmHg  Pulse 77  Temp(Src) 99.2 F (37.3 C) (Oral)  Resp 18  Ht  (1.753 m)  Wt 168 lb (76.204 kg)  BMI 24.80 kg/m2  SpO2 99% Ideal Body Weight: Weight in (lb) to have BMI = 25: 168.9  Physical Exam  Constitutional: He is oriented to person, place, and time. He appears well-developed and well-nourished. No distress.  HENT:  Head: Normocephalic and atraumatic.  Eyes: Conjunctivae and EOM are normal. Pupils are equal, round, and reactive to light.  Cardiovascular: Normal rate.   Pulmonary/Chest: Effort normal. No respiratory distress.  Abdominal: Normal appearance and bowel sounds are normal. There is no hepatosplenomegaly. There is no tenderness.  Lymphadenopathy:    He has cervical adenopathy.       Right cervical: Posterior cervical adenopathy present.       Left cervical: Posterior cervical adenopathy present.  Neurological: He is alert and oriented to person, place, and time.  Skin: Skin is warm and dry. He is not diaphoretic.  Psychiatric: He has a normal mood and affect. His behavior is normal.    Results for orders placed  or performed in visit on 09/08/15  POCT CBC  Result Value Ref Range   WBC 6.9 4.6 - 10.2 K/uL   Lymph, poc 1.6 0.6 - 3.4   POC LYMPH PERCENT 23.6 10 - 50 %L   MID (cbc) 0.4 0 - 0.9   POC MID % 6.1 0 - 12 %M   POC Granulocyte 4.9 2 - 6.9   Granulocyte percent 70.3 37 - 80 %G   RBC 5.07 4.69 - 6.13 M/uL   Hemoglobin 15.7 14.1 - 18.1 g/dL   HCT, POC 16.143.7 09.643.5 - 53.7 %   MCV 86.2 80 - 97 fL   MCH, POC 31.0 27 - 31.2 pg   MCHC 36.0 (A) 31.8 - 35.4 g/dL   RDW, POC 04.513.0 %   Platelet Count, POC 252 142 - 424 K/uL   MPV 7.0 0 - 99.8 fL  POCT rapid strep A  Result Value  Ref Range   Rapid Strep A Screen Negative Negative     Assessment and Plan: Ryan Dougherty is a 25 y.o. male who is here today for cervical lymph nodes. Various labs given.  This likely is reactive lymphadenitis.  Treating with doxycycline at this time.    Cervical lymphadenopathy - Plan: POCT CBC, POCT SEDIMENTATION RATE, POCT rapid strep A, Culture, Group A Strep, C-reactive protein, B. burgdorfi antibodies, Epstein-Barr virus VCA antibody panel, HIV antibody, COMPLETE METABOLIC PANEL WITH GFR, [place] spotted fvr abs pnl(IgG+IgM), US SOFT TISSUE NECK, doxycycline (VIBRAMYCIN) 100 MG capsule, CANCELED: US SOFT TISSUE NECK  Trena PlattStephanie Jayzon Taras, PA-C [facility] 09/08/2015 4:01 PM

## 2015-09-08 NOTE — Patient Instructions (Addendum)
     IF you received an x-ray today, you will receive an invoice from [facility]. Please contact [facility] at [phone] with questions or concerns regarding your invoice.   IF you received labwork today, you will receive an invoice from [facility]. Please contact [facility] at [phone] with questions or concerns regarding your invoice.   Our billing staff will not be able to assist you with questions regarding bills from these companies.  You will be contacted with the lab results as soon as they are available. The fastest way to get your results is to activate your My Chart account. Instructions are located on the last page of this paperwork. If you have not heard from us regarding the results in 2 weeks, please contact this office.    We will start you on doxycycline at this time.   I would like you to take the antibiotic to completion. I would like you to hydrate well. I am going to have your results within the next 7 days. If they are alarming, I will contact you sooner.

## 2015-09-09 LAB — LYME AB/WESTERN BLOT REFLEX: B burgdorferi Ab IgG+IgM: <0.9 Index (ref <0.90)

## 2015-09-09 LAB — EPSTEIN-BARR VIRUS VCA ANTIBODY PANEL
EBV EA IGG: 7.7 U/mL (ref <9.0)
EBV NA IGG: 59.4 U/mL — AB (ref <18.0)
EBV VCA IGG: 339 U/mL — AB (ref <18.0)
EBV VCA IgM: 47.7 U/mL — ABNORMAL HIGH (ref <36.0)

## 2015-09-10 LAB — CULTURE, GROUP A STREP: Organism ID, Bacteria: NORMAL

## 2015-09-12 ENCOUNTER — Other Ambulatory Visit: Payer: No Typology Code available for payment source

## 2015-09-12 LAB — ROCKY MTN SPOTTED FVR ABS PNL(IGG+IGM)
RMSF IgG: NOT DETECTED
RMSF IgM: NOT DETECTED

## 2015-09-16 ENCOUNTER — Telehealth: Payer: Self-pay | Admitting: *Deleted

## 2015-09-16 NOTE — Telephone Encounter (Signed)
Patient called back stating he was feeling better.  However, he feels as though he does not need to have the U/S done because he is doing better.  I advised the patient it was up to him but if his sxs were not improving then he should go.  Pt understood.

## 2015-09-18 ENCOUNTER — Other Ambulatory Visit: Payer: No Typology Code available for payment source

## 2015-09-18 NOTE — Telephone Encounter (Signed)
Discussed result with patient.  He may delay the appt. For ultrasound, as he has noticed improvement.  I have advised him that if this does not clear up within 2 weeks, he should proceed.  Likely it is reactive (2nd to mono), however we should rule out more concerning causes, if it does not clear up.

## 2015-11-11 ENCOUNTER — Other Ambulatory Visit: Payer: Self-pay | Admitting: Physician Assistant

## 2015-11-11 ENCOUNTER — Ambulatory Visit (INDEPENDENT_AMBULATORY_CARE_PROVIDER_SITE_OTHER): Payer: BLUE CROSS/BLUE SHIELD | Admitting: Physician Assistant

## 2015-11-11 VITALS — BP 122/72 | HR 74 | Temp 98.8°F | Resp 17 | Ht 69.0 in | Wt 167.0 lb

## 2015-11-11 DIAGNOSIS — R590 Localized enlarged lymph nodes: Secondary | ICD-10-CM | POA: Diagnosis not present

## 2015-11-11 DIAGNOSIS — R5383 Other fatigue: Secondary | ICD-10-CM

## 2015-11-11 LAB — POCT CBC
GRANULOCYTE PERCENT: 66.4 % (ref 37–80)
HCT, POC: 43.2 % — AB (ref 43.5–53.7)
HEMOGLOBIN: 15.3 g/dL (ref 14.1–18.1)
Lymph, poc: 1.5 (ref 0.6–3.4)
MCH: 30.5 pg (ref 27–31.2)
MCHC: 35.4 g/dL (ref 31.8–35.4)
MCV: 86 fL (ref 80–97)
MID (CBC): 0.5 (ref 0–0.9)
MPV: 6.9 fL (ref 0–99.8)
PLATELET COUNT, POC: 251 10*3/uL (ref 142–424)
POC Granulocyte: 4.1 (ref 2–6.9)
POC LYMPH PERCENT: 25.3 %L (ref 10–50)
POC MID %: 8.3 % (ref 0–12)
RBC: 5.02 M/uL (ref 4.69–6.13)
RDW, POC: 13.4 %
WBC: 6.1 10*3/uL (ref 4.6–10.2)

## 2015-11-11 LAB — POCT RAPID STREP A (OFFICE): Rapid Strep A Screen: NEGATIVE

## 2015-11-11 NOTE — Patient Instructions (Addendum)
IF you received an x-ray today, you will receive an invoice from [facility]. Please contact [facility] at [phone] with questions or concerns regarding your invoice.   IF you received labwork today, you will receive an invoice from [facility]. Please contact [facility] at [phone] with questions or concerns regarding your invoice.   Our billing staff will not be able to assist you with questions regarding bills from these companies.  You will be contacted with the lab results as soon as they are available. The fastest way to get your results is to activate your My Chart account. Instructions are located on the last page of this paperwork. If you have not heard from us regarding the results in 2 weeks, please contact this office.    I would like you to await contact for ultrasound.   Please make sure that you are hydrating well, and that you are eating clean vegetables and fruits.  Infectious Mononucleosis Infectious mononucleosis is an infection caused by a virus. This illness is often called "mono." It causes symptoms that affect various areas of the body, including the throat, upper air passages, and lymph glands. The liver or spleen may also be affected. The virus spreads from person to person through close contact. The illness is usually not serious and often goes away in 2-4 weeks without treatment. In rare cases, symptoms can be more severe and last longer, sometimes up to several months. Because the illness can sometimes cause the liver or spleen to become enlarged, you should not participate in contact sports or strenuous exercise until your health care provider approves. CAUSES  Infectious mononucleosis is caused by the Epstein-Barr virus. This virus spreads through contact with an infected person's saliva or other bodily fluids. It is often spread through kissing. It may also spread through coughing or sharing utensils or drinking  glasses that were recently used by an infected person. An infected person will not always appear ill but can still spread the virus. RISK FACTORS This illness is most common in adolescents and young adults. SIGNS AND SYMPTOMS  The most common symptoms of infectious mononucleosis are:  Sore throat.   Headache.   Fatigue.   Muscle aches.   Swollen glands.   Fever.   Poor appetite.   Enlarged liver or spleen.  Some less common symptoms that can also occur include:  Rash. This is more common if you take antibiotic medicines.  Feeling sick to your stomach (nauseous).   Abdominal pain.  DIAGNOSIS  Your health care provider will take your medical history and do a physical exam. Blood tests can be done to confirm the diagnosis.  TREATMENT  Infectious mononucleosis usually goes away on its own with time. It cannot be cured with medicines, but medicines are sometimes used to relieve symptoms. Steroid medicine is sometimes needed if the swelling in the throat causes breathing or swallowing problems. Treatment in a hospital is sometimes needed for severe cases.  HOME CARE INSTRUCTIONS   Rest as needed.   Do not participate in contact sports, strenuous exercise, or heavy lifting until your health care provider approves. The liver and spleen could be seriously injured if they are enlarged from the illness. You may need to wait a couple months before participating in sports.   Drink enough fluid to keep your urine clear or pale yellow.   Do not drink alcohol.  Take medicines only as directed by your health care provider. Children under 18 years of  age should not take aspirin because of the association with Reye syndrome.   Eat soft foods. Cold foods such as ice cream or frozen ice pops can soothe a sore throat.  If you have a sore throat, gargle with a mixture of salt and water. This may help relieve your discomfort. Mix 1 tsp of salt in 1 cup of warm water. Sucking on  hard candy may also help.   Start regular activities gradually after the fever is gone. Be sure to rest when tired.   Avoid kissing or sharing utensils or drinking glasses until your health care provider tells you that you are no longer contagious.  PREVENTION  To avoid spreading the virus, do not kiss anyone or share utensils, drinking glasses, or food until your health care provider tells you that you are no longer contagious. SEEK MEDICAL CARE IF:   Your fever is not gone after 10 days.  You have swollen lymph nodes that are not back to normal after 4 weeks.  Your activity level is not back to normal after 2 months.   You have yellow coloring to your eyes and skin (jaundice).  You have constipation.  SEEK IMMEDIATE MEDICAL CARE IF:   You have severe pain in the abdomen or shoulder.  You are drooling.  You have trouble swallowing.  You have trouble breathing.  You develop a stiff neck.  You develop a severe headache.  You cannot stop throwing up (vomiting).  You have convulsions.  You are confused.  You have trouble with balance.  You have signs of dehydration. These may include:  Weakness.  Sunken eyes.  Pale skin.  Dry mouth.  Rapid breathing or pulse.   This information is not intended to replace advice given to you by your health care provider. Make sure you discuss any questions you have with your health care provider.   Document Released: 04/16/2000 Document Revised: 05/10/2014 Document Reviewed: 12/25/2013 Elsevier Interactive Patient Education [employer]

## 2015-11-11 NOTE — Progress Notes (Signed)
[facility] [address] [place] [address] [phone]  Date:  11/11/2015   Name:  Ryan Dougherty   DOB:  05-24-1990   MRN:  [id number]  PCP:  No PCP Per Patient    History of Present Illness:  Ryan Dougherty is a 25 y.o. male patient who presents to [facility] for continued fatigue. Patient was seen about 2 months ago for swollen lymph nodes.  Work up included the dx of ebv.  Given doxycycline for tx of lyme.  Advised rest and supportive treatment.  Patient reported that his symptoms resolved including the swollen lymph node within 2 weeks of his sxs.   Today he states that for the last 2.5 weeks he has noticed resurfacing swollen lymph nodes, dry cough, sore throat, and fatigue.  No nausea.  No fever, but feels clammy and tired.    Patient Active Problem List   Diagnosis Date Noted  . Palpitations 03/21/2013  . Fatigue 03/21/2013  . Dyspnea 03/21/2013    No past medical history on file.  No past surgical history on file.  Social History  Substance Use Topics  . Smoking status: Never Smoker   . Smokeless tobacco: Never Used  . Alcohol Use: No    Family History  Problem Relation Age of Onset  . Heart attack Maternal Grandfather 79  . Hypertension Maternal Grandfather     Allergies  Allergen Reactions  . Penicillins Rash  . Codeine Rash    Medication list has been reviewed and updated.  No current outpatient prescriptions on file prior to visit.   No current facility-administered medications on file prior to visit.    ROS ROS otherwise unremarkable unless listed above.   Physical Examination: BP 122/72 mmHg  Pulse 74  Temp(Src) 98.8 F (37.1 C) (Oral)  Resp 17  Ht  (1.753 m)  Wt 167 lb (75.751 kg)  BMI 24.65 kg/m2  SpO2 98% Ideal Body Weight: Weight in (lb) to have BMI = 25: 168.9  Physical Exam  Constitutional: He is oriented to person, place, and time. He appears well-developed and well-nourished. No distress.   HENT:  Head: Atraumatic.  Right Ear: Tympanic membrane, external ear and ear canal normal.  Left Ear: Tympanic membrane, external ear and ear canal normal.  Nose: No mucosal edema or rhinorrhea. Right sinus exhibits no maxillary sinus tenderness and no frontal sinus tenderness. Left sinus exhibits no maxillary sinus tenderness and no frontal sinus tenderness.  Mouth/Throat: No uvula swelling. No oropharyngeal exudate, posterior oropharyngeal edema or posterior oropharyngeal erythema.  Eyes: Conjunctivae, EOM and lids are normal. Pupils are equal, round, and reactive to light. Right eye exhibits normal extraocular motion. Left eye exhibits normal extraocular motion.  Neck: Trachea normal and full passive range of motion without pain. No edema and no erythema present. No thyroid mass and no thyromegaly present.  Cardiovascular: Normal rate.  Exam reveals no gallop and no friction rub.   No murmur heard. Pulmonary/Chest: Effort normal. No respiratory distress. He has no decreased breath sounds. He has no wheezes. He has no rhonchi.  Abdominal: Soft. Normal appearance and bowel sounds are normal. There is no hepatosplenomegaly. There is no tenderness.  Lymphadenopathy:    He has no cervical adenopathy.       Right: Supraclavicular adenopathy present.       Left: No supraclavicular adenopathy present.  Neurological: He is alert and oriented to person, place, and time.  Skin: Skin is warm and dry.  He is not diaphoretic.  Psychiatric: He has a normal mood and affect. His behavior is normal.     Assessment and Plan: Ryan Dougherty is a 25 y.o. male who is here today for continued fever. Possible EBV prodrome. I have advised that he eat well and hydration.  Following labs redone.   Other fatigue - Plan: POCT CBC, B. burgdorfi antibodies, Rocky mtn spotted fvr abs pnl(IgG+IgM), POCT rapid strep A, COMPLETE METABOLIC PANEL WITH GFR  Cervical lymphadenopathy - Plan: POCT CBC, B. burgdorfi  antibodies, Rocky mtn spotted fvr abs pnl(IgG+IgM), POCT rapid strep A, COMPLETE METABOLIC PANEL WITH GFR, CANCELED: Culture, Group A Strep  Trena PlattStephanie English, PA-C [facility] 11/11/2015 11:13 AM

## 2015-11-12 LAB — LYME AB/WESTERN BLOT REFLEX: B burgdorferi Ab IgG+IgM: <0.9 Index (ref <0.90)

## 2015-11-12 LAB — ROCKY MTN SPOTTED FVR ABS PNL(IGG+IGM)
RMSF IGG: NOT DETECTED
RMSF IGM: NOT DETECTED

## 2015-11-27 ENCOUNTER — Ambulatory Visit (INDEPENDENT_AMBULATORY_CARE_PROVIDER_SITE_OTHER): Payer: BLUE CROSS/BLUE SHIELD | Admitting: Physician Assistant

## 2015-11-27 VITALS — BP 114/68 | HR 71 | Temp 98.9°F | Resp 16 | Ht 69.0 in | Wt 164.0 lb

## 2015-11-27 DIAGNOSIS — R1030 Lower abdominal pain, unspecified: Secondary | ICD-10-CM | POA: Diagnosis not present

## 2015-11-27 DIAGNOSIS — R590 Localized enlarged lymph nodes: Secondary | ICD-10-CM | POA: Diagnosis not present

## 2015-11-27 LAB — POCT URINALYSIS DIP (MANUAL ENTRY)
Bilirubin, UA: NEGATIVE
Blood, UA: NEGATIVE
Glucose, UA: NEGATIVE
Ketones, POC UA: NEGATIVE
LEUKOCYTES UA: NEGATIVE
NITRITE UA: NEGATIVE
PH UA: 5.5
PROTEIN UA: NEGATIVE
Spec Grav, UA: <=1.005
Urobilinogen, UA: 0.2

## 2015-11-27 LAB — POC MICROSCOPIC URINALYSIS (UMFC): Mucus: ABSENT

## 2015-11-27 NOTE — Patient Instructions (Addendum)
     IF you received an x-ray today, you will receive an invoice from [facility]. Please contact [facility] at [phone] with questions or concerns regarding your invoice.   IF you received labwork today, you will receive an invoice from [facility]. Please contact [facility] at [phone] with questions or concerns regarding your invoice.   Our billing staff will not be able to assist you with questions regarding bills from these companies.  You will be contacted with the lab results as soon as they are available. The fastest way to get your results is to activate your My Chart account. Instructions are located on the last page of this paperwork. If you have not heard from [place] regarding the results in 2 weeks, please contact this office.     Please await contact for ultrasound within 1 week.  I would like you to contact us if this is not performed.  You may use ibuprofen (600mg  every 6 hours is fine) for a pain as needed.

## 2015-11-27 NOTE — Progress Notes (Signed)
[facility] [address] [place] [address] [phone]  Date:  11/27/2015   Name:  Ryan Dougherty   DOB:  January 04, 1991   MRN:  [id number]  PCP:  No PCP Per Patient    History of Present Illness:  Ryan Dougherty is a 25 y.o. male patient who presents to [facility] for cc of abdominal pain.   --pelvic pain that is cramping--and also has scrotum ache.  Lasted for a couple weeks but went away.  He was lifting a lot of weights, and the pain will come back which has a pulling in character.   --For the past 3 weeks, it has returned.  No swelling.  No dysuria, but may have had some urinary frequency.   --Left side may feel a little uncomfortable.   --Exercises: every day with yoga and jogging.  No penile discharge. --BM once per day.  No blood in stool or black stool. --occasional nausea.   --sexually active:   Patient Active Problem List   Diagnosis Date Noted  . Palpitations 03/21/2013  . Fatigue 03/21/2013  . Dyspnea 03/21/2013    No past medical history on file.  No past surgical history on file.  Social History  Substance Use Topics  . Smoking status: Never Smoker  . Smokeless tobacco: Never Used  . Alcohol use No    Family History  Problem Relation Age of Onset  . Heart attack Maternal Grandfather 79  . Hypertension Maternal Grandfather     Allergies  Allergen Reactions  . Penicillins Rash  . Codeine Rash    Medication list has been reviewed and updated.  No current outpatient prescriptions on file prior to visit.   No current facility-administered medications on file prior to visit.     ROS ROS otherwise unremarkable unless listed above.   Physical Examination: BP 114/68 (BP Location: Right Arm, Patient Position: Sitting, Cuff Size: Normal)   Pulse 71   Temp 98.9 F (37.2 C)   Resp 16   Ht  (1.753 m)   Wt 164 lb (74.4 kg)   SpO2 98%   BMI 24.22 kg/m  Ideal Body Weight: Weight in (lb) to have BMI = 25:  168.9  Physical Exam  Constitutional: He is oriented to person, place, and time. He appears well-developed and well-nourished. No distress.  HENT:  Head: Normocephalic and atraumatic.  Mouth/Throat: No oropharyngeal exudate, posterior oropharyngeal edema or posterior oropharyngeal erythema.  Eyes: Conjunctivae and EOM are normal. Pupils are equal, round, and reactive to light.  Cardiovascular: Normal rate.   Pulmonary/Chest: Effort normal. No respiratory distress.  Abdominal: Soft. Normal appearance and bowel sounds are normal. There is tenderness in the right lower quadrant. There is no CVA tenderness, no tenderness at McBurney's point and negative Murphy's sign. Hernia confirmed negative in the right inguinal area and confirmed negative in the left inguinal area.  Genitourinary: Testes normal and penis normal. Right testis shows no mass. Left testis shows no mass.  Lymphadenopathy:       Head (right side): No submandibular adenopathy present.       Head (left side): No submandibular adenopathy present.    He has no cervical adenopathy.    He has no axillary adenopathy.  Neurological: He is alert and oriented to person, place, and time.  Skin: Skin is warm and dry. He is not diaphoretic.  Psychiatric: He has a normal mood and affect. His behavior is normal.     Results for  orders placed or performed in visit on 11/27/15  POCT urinalysis dipstick  Result Value Ref Range   Color, UA yellow yellow   Clarity, UA clear clear   Glucose, UA negative negative   Bilirubin, UA negative negative   Ketones, POC UA negative negative   Spec Grav, UA <=1.005    Blood, UA negative negative   pH, UA 5.5    Protein Ur, POC negative negative   Urobilinogen, UA 0.2    Nitrite, UA Negative Negative   Leukocytes, UA Negative Negative  POCT Microscopic Urinalysis ([facility])  Result Value Ref Range   WBC,UR,HPF,POC None None WBC/hpf   RBC,UR,HPF,POC None None RBC/hpf   Bacteria None None, Too numerous  to count   Mucus Absent Absent   Epithelial Cells, UR Per Microscopy None None, Too numerous to count cells/hpf    Assessment and Plan: Ryan Dougherty is a 25 y.o. male who is here today for cc of abdominal/groin pain. (hernia, varicocele, muscle strain)--us ordered today.  Attempting to arrange both at the same time, with neck.  Groin pain, unspecified laterality - Plan: POCT urinalysis dipstick, POCT Microscopic Urinalysis ([facility]), [place] Art/Ven Flow Abd Pelv Doppler, CANCELED: US Scrotum  Cervical lymphadenopathy - Plan: US Soft Tissue Head/Neck  Trena Platt, PA-C [facility] 11/27/2015 5:57 PM

## 2015-12-16 ENCOUNTER — Ambulatory Visit
Admission: RE | Admit: 2015-12-16 | Discharge: 2015-12-16 | Disposition: A | Discharge status: Home / Self Care | Payer: BLUE CROSS/BLUE SHIELD | Source: Ambulatory Visit | Attending: Physician Assistant | Admitting: Physician Assistant

## 2015-12-16 ENCOUNTER — Other Ambulatory Visit: Payer: Self-pay | Admitting: Physician Assistant

## 2015-12-16 DIAGNOSIS — R1030 Lower abdominal pain, unspecified: Secondary | ICD-10-CM

## 2015-12-18 ENCOUNTER — Other Ambulatory Visit: Payer: Self-pay | Admitting: Physician Assistant

## 2015-12-18 ENCOUNTER — Telehealth: Payer: Self-pay

## 2015-12-18 DIAGNOSIS — N50819 Testicular pain, unspecified: Secondary | ICD-10-CM

## 2015-12-18 DIAGNOSIS — R1031 Right lower quadrant pain: Secondary | ICD-10-CM

## 2015-12-18 NOTE — Progress Notes (Signed)
Please place with urology.

## 2015-12-18 NOTE — Telephone Encounter (Signed)
Spoke with pt and [place] message to [place].

## 2015-12-18 NOTE — Telephone Encounter (Signed)
Pt missed his call concerning his ultrasound results from 8/15. Please advise at [phone]

## 2015-12-18 NOTE — Telephone Encounter (Signed)
Patient called again. Please call 

## 2017-02-06 IMAGING — US US SOFT TISSUE HEAD/NECK
1 series · 14 of 25 positions shown · non-contrast
Comparison: None.

CLINICAL DATA: Cervical adenopathy

EXAM:
ULTRASOUND OF HEAD/NECK SOFT TISSUES
TECHNIQUE: Ultrasound examination of the head and neck soft tissues was
performed in the area of clinical concern.

[Series 1: us soft tissue head/neck · 0.08mm/px · 14 of 27 slices shown]
[im 1/27]
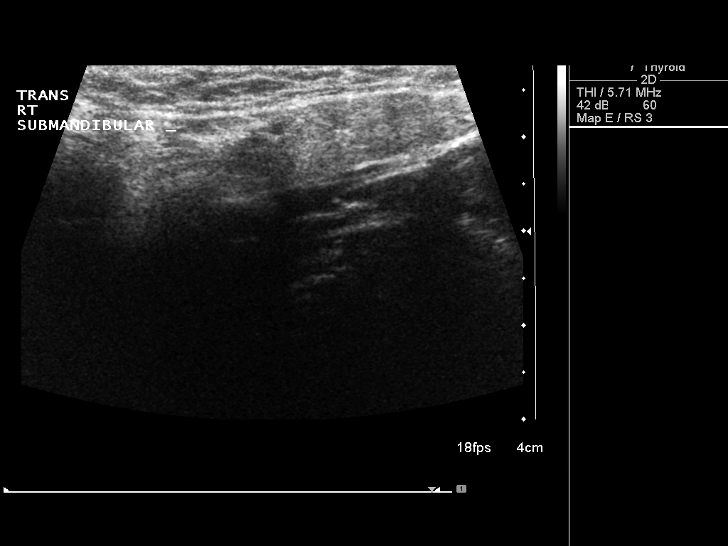
[im 3/27]
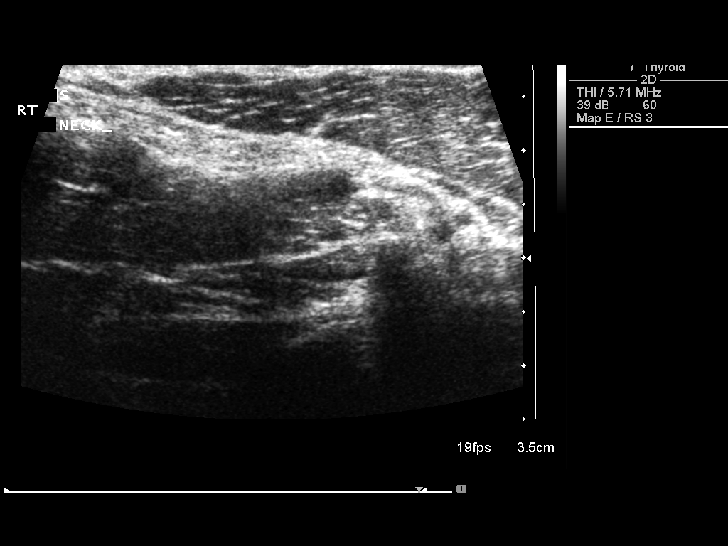
[im 5/27]
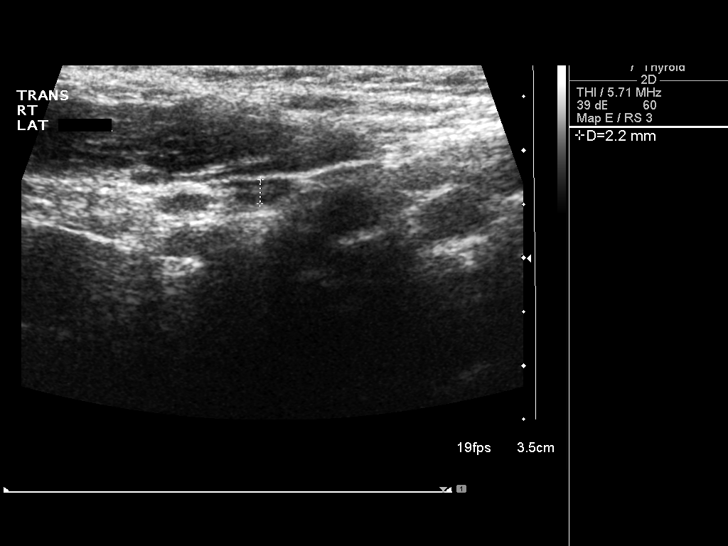
[im 7/27]
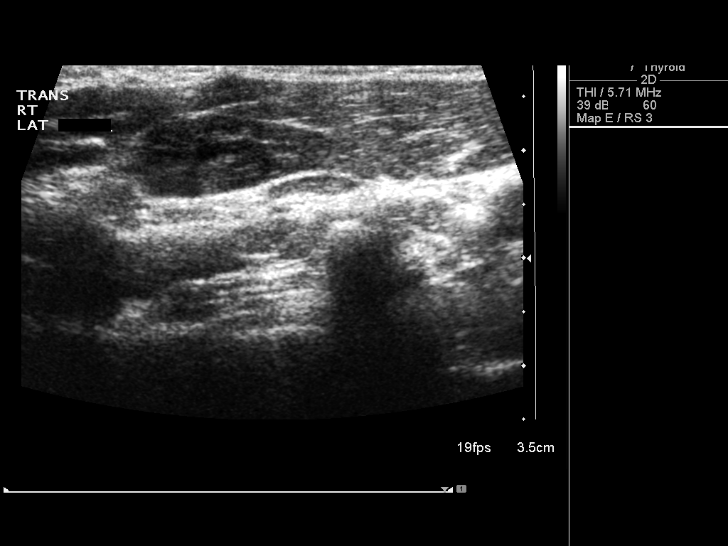
[im 9/27]
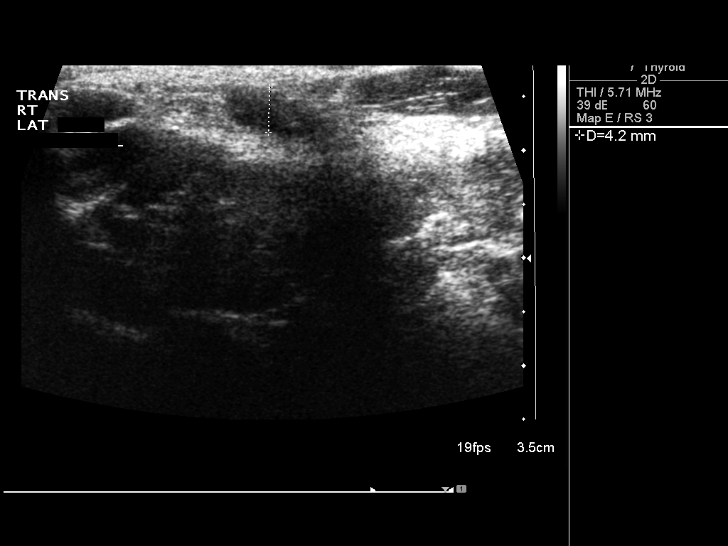
[im 10/27]
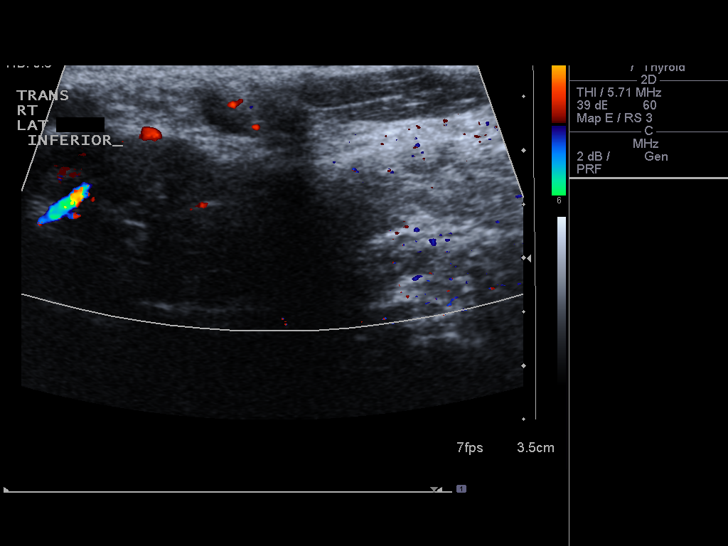
[im 12/27]
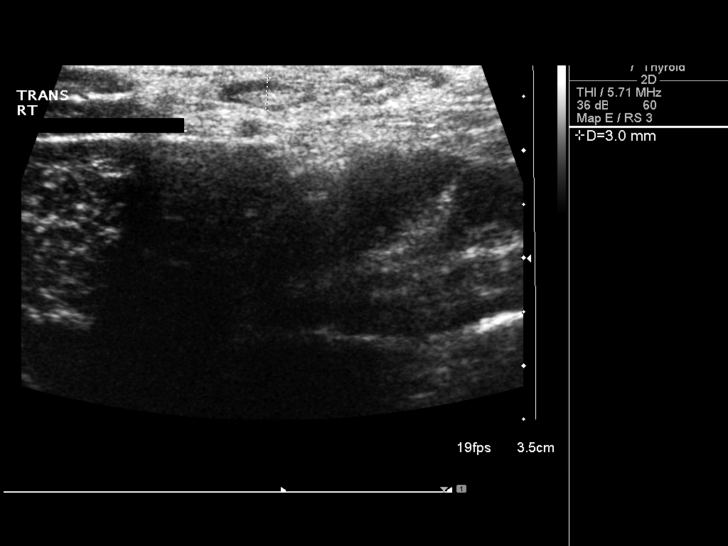
[im 15/27]
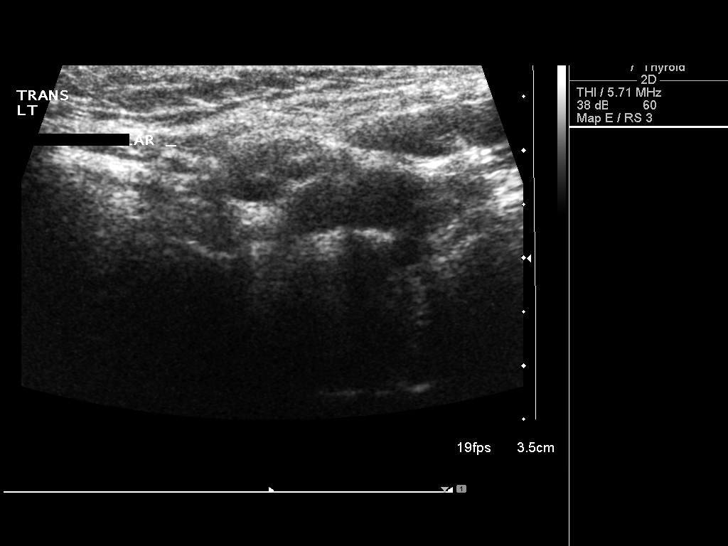
[im 17/27]
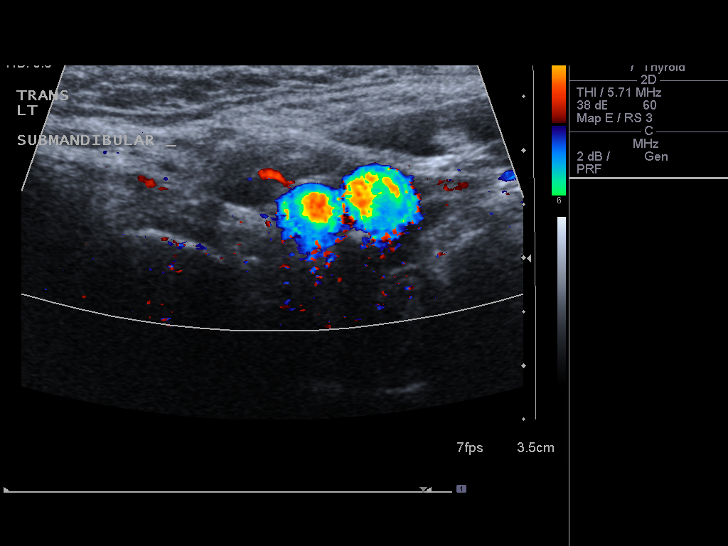
[im 18/27]
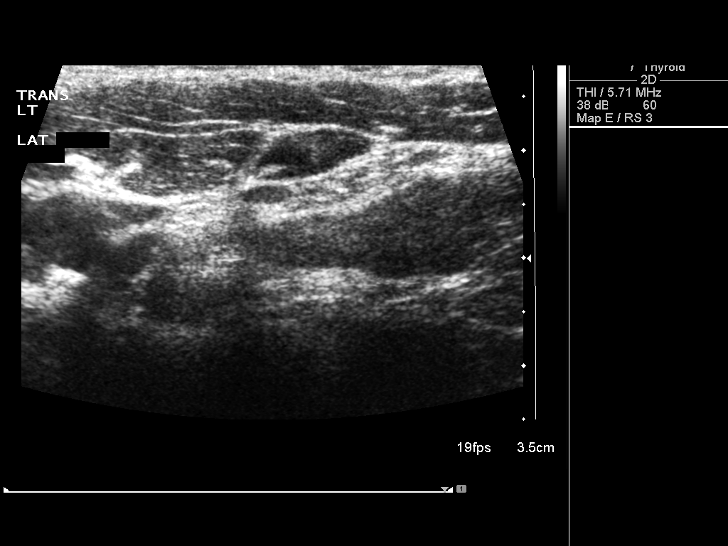
[im 20/27]
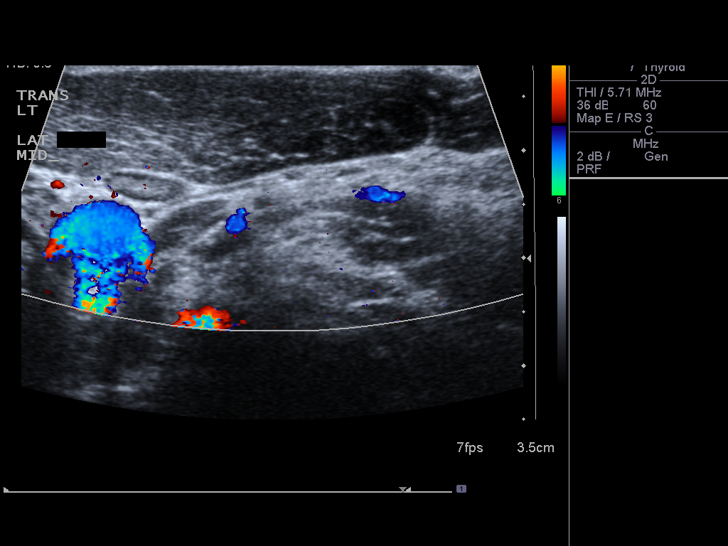
[im 22/27]
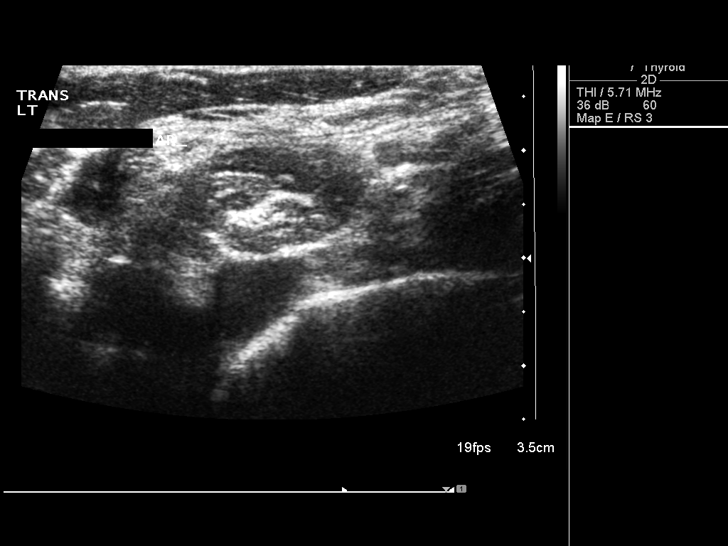
[im 24/27]
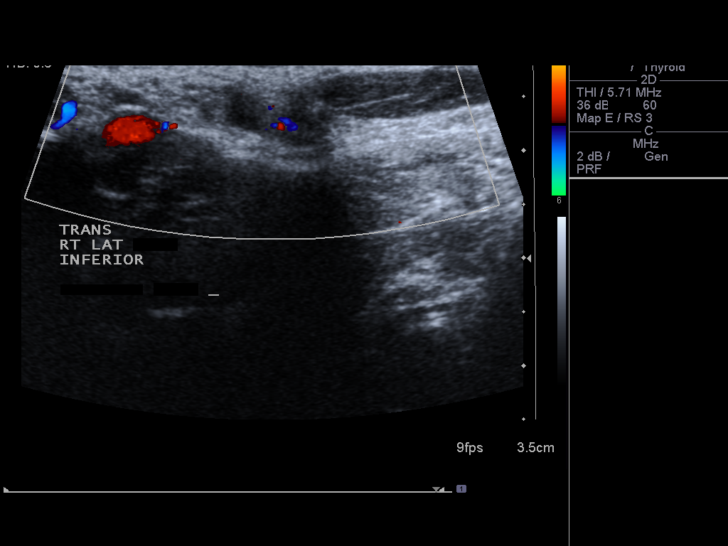
[im 27/27]
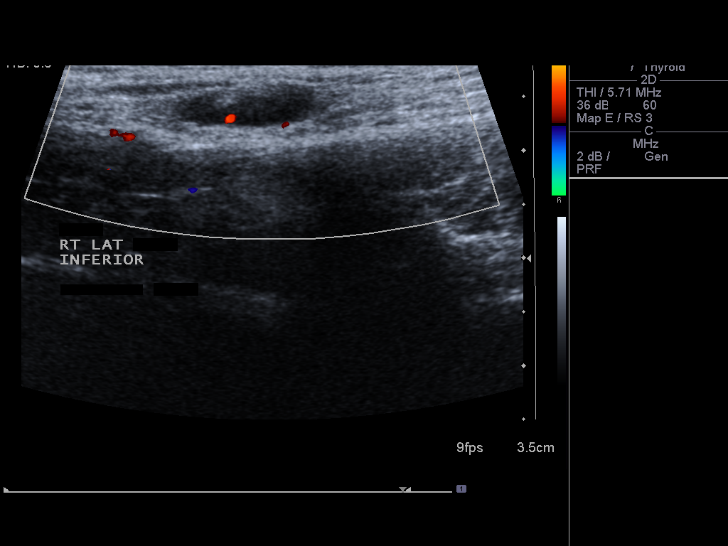

[14 of 25 positions shown; findings below may reference images not displayed]

FINDINGS: Imaging over the right and left side of the neck reveals small
subcentimeter short axis diameter lymph nodes with fatty hila. These
have a benign appearance.
IMPRESSION: Imaging demonstrates normal-appearing bilateral neck lymph nodes.

## 2017-02-22 NOTE — Progress Notes (Addendum)
[facility] [address], [address] [place], [place] [address] [phone] [phone]  Date:  02/24/2017   Name:  Ryan PiggJason T Dougherty   DOB:  1991/03/05   MRN:  [id number]  PCP:  Pearline Cablesopland, Guadalupe Kerekes C, MD    Chief Complaint: Establish Care (Pt here to est care. )   History of Present Illness:  Ryan Dougherty is a 26 y.o. very pleasant male patient who presents with the following:  Here today as a new patient , but [facility] when we discussed anxiety He has been pretty good since we saw each other. 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  However it does remind him of when he had mono last summer.  He also had a cold last week with a low grade fever He did have a dry cough and ST, sinus pressure- this is clearing up No GI symptoms  He was exposed to some mold He is a never smoker Also admits that he was drinking a whole lot of caffeine he is trying to cut down on this and wonders if withdrawal could be causing some of his sx Denies any risk factors for HIV, negative screening a year ago    Patient Active Problem List   Diagnosis Date Noted  . Palpitations 03/21/2013  . Fatigue 03/21/2013  . Dyspnea 03/21/2013   Flu: he plans to do later on this season Tetanus:2013  Never a smoker   Past Medical History:  Diagnosis Date  . GERD (gastroesophageal reflux disease)     History reviewed. No pertinent surgical history.  Social History  Substance Use Topics  . Smoking status: Never Smoker  . Smokeless tobacco: Never Used  . Alcohol use No    Family History  Problem Relation Age of Onset  . Heart attack Maternal Grandfather 79  .  Hypertension Maternal Grandfather   . Breast cancer Maternal Grandmother   . Arthritis Maternal Grandmother     Allergies  Allergen Reactions  . Penicillins Rash  . Codeine Rash    Medication list has been reviewed and updated.  No current outpatient prescriptions on file prior to visit.   No current facility-administered medications on file prior to visit.     Review of Systems:  As per HPI- otherwise negative. No current fever or chills No abd pain No nausea or vomiting No rash    Physical Examination: Vitals:   02/24/17 1319  BP: 123/74  Pulse: 88  Temp: 97.8 F (36.6 C)  SpO2: 100%   Vitals:   02/24/17 1319  Weight: 150 lb 3.2 oz (68.1 kg)  Height: 5\' 10"  (1.778 m)   Body mass index is 21.55 kg/m. Ideal Body Weight: Weight in (lb) to have BMI = 25: 173.9  GEN: WDWN, NAD, Non-toxic, A & O x 3, slim build, looks well HEENT: Atraumatic, Normocephalic. Neck supple. No masses, No LAD.  Bilateral TM wnl, oropharynx normal.  PEERL,EOMI.   Ears and Nose: No external deformity. CV: RRR, No M/G/R. No JVD. No thrill. No extra heart sounds. PULM: CTA B, no wheezes, crackles, rhonchi.  No retractions. No resp. distress. No accessory muscle use. ABD: S, NT, ND. No rebound. No HSM. EXTR: No c/c/e NEURO Normal gait.  PSYCH: Normally interactive. Conversant. Not depressed or anxious appearing.  Calm demeanor.    Assessment and Plan: Screening for deficiency anemia - Plan: CBC  Screening for hyperlipidemia - Plan: Lipid panel  Screening for diabetes mellitus - Plan: Comprehensive metabolic panel  Body aches - Plan: TSH, Sedimentation rate  Other fatigue - Plan: TSH  Here today to re-establish care He had mono last year He noted feeling just not 100% recently will do basic labs as above and follow-up with him asap    Signed Abbe Amsterdam, MD  Received his labs 10/26  Results for orders placed or performed in visit on 02/24/17  CBC  Result Value Ref  Range   WBC 9.2 4.0 - 10.5 K/uL   RBC 5.10 4.22 - 5.81 Mil/uL   Platelets 335.0 150.0 - 400.0 K/uL   Hemoglobin 15.2 13.0 - 17.0 g/dL   HCT 11.9 14.7 - 82.9 %   MCV 89.7 78.0 - 100.0 fl   MCHC 33.3 30.0 - 36.0 g/dL   RDW 56.2 13.0 - 86.5 %  Comprehensive metabolic panel  Result Value Ref Range   Sodium 139 135 - 145 mEq/L   Potassium 4.8 3.5 - 5.1 mEq/L   Chloride 101 96 - 112 mEq/L   CO2 28 19 - 32 mEq/L   Glucose, Bld 99 70 - 99 mg/dL   BUN 16 6 - 23 mg/dL   Creatinine, Ser 7.84 0.40 - 1.50 mg/dL   Total Bilirubin 0.6 0.2 - 1.2 mg/dL   Alkaline Phosphatase 103 39 - 117 U/L   AST 23 0 - 37 U/L   ALT 32 0 - 53 U/L   Total Protein 7.9 6.0 - 8.3 g/dL   Albumin 5.0 3.5 - 5.2 g/dL   Calcium 69.6 8.4 - 29.5 mg/dL   GFR 28.41 >32.44 mL/min  Lipid panel  Result Value Ref Range   Cholesterol 138 0 - 200 mg/dL   Triglycerides 01.0 0.0 - 149.0 mg/dL   HDL 27.25 >36.64 mg/dL   VLDL 40.3 0.0 - 47.4 mg/dL   LDL Cholesterol 60 0 - 99 mg/dL   Total CHOL/HDL Ratio 2    NonHDL 74.80   TSH  Result Value Ref Range   TSH 2.24 0.35 - 4.50 uIU/mL  Sedimentation rate  Result Value Ref Range   Sed Rate 7 0 - 15 mm/hr   Letter to pt

## 2017-02-24 ENCOUNTER — Ambulatory Visit (INDEPENDENT_AMBULATORY_CARE_PROVIDER_SITE_OTHER): Payer: PRIVATE HEALTH INSURANCE | Admitting: Family Medicine

## 2017-02-24 ENCOUNTER — Encounter: Payer: Self-pay | Admitting: Family Medicine

## 2017-02-24 VITALS — BP 123/74 | HR 88 | Temp 97.8°F | Ht 70.0 in | Wt 150.2 lb

## 2017-02-24 DIAGNOSIS — Z131 Encounter for screening for diabetes mellitus: Secondary | ICD-10-CM

## 2017-02-24 DIAGNOSIS — Z13220 Encounter for screening for lipoid disorders: Secondary | ICD-10-CM

## 2017-02-24 DIAGNOSIS — R5383 Other fatigue: Secondary | ICD-10-CM

## 2017-02-24 DIAGNOSIS — R52 Pain, unspecified: Secondary | ICD-10-CM

## 2017-02-24 DIAGNOSIS — Z130 Encounter for screening for diseases of the blood and blood-forming organs and certain disorders involving the immune mechanism: Secondary | ICD-10-CM | POA: Diagnosis not present

## 2017-02-24 LAB — LIPID PANEL
CHOL/HDL RATIO: 2
Cholesterol: 138 mg/dL (ref 0–200)
HDL: 63 mg/dL (ref >39.00)
LDL Cholesterol: 60 mg/dL (ref 0–99)
NONHDL: 74.8
Triglycerides: 76 mg/dL (ref 0.0–149.0)
VLDL: 15.2 mg/dL (ref 0.0–40.0)

## 2017-02-24 LAB — COMPREHENSIVE METABOLIC PANEL
ALT: 32 U/L (ref 0–53)
AST: 23 U/L (ref 0–37)
Albumin: 5 g/dL (ref 3.5–5.2)
Alkaline Phosphatase: 103 U/L (ref 39–117)
BILIRUBIN TOTAL: 0.6 mg/dL (ref 0.2–1.2)
BUN: 16 mg/dL (ref 6–23)
CO2: 28 meq/L (ref 19–32)
Calcium: 10.3 mg/dL (ref 8.4–10.5)
Chloride: 101 mEq/L (ref 96–112)
Creatinine, Ser: 0.97 mg/dL (ref 0.40–1.50)
GFR: 98.42 mL/min (ref >60.00)
GLUCOSE: 99 mg/dL (ref 70–99)
POTASSIUM: 4.8 meq/L (ref 3.5–5.1)
SODIUM: 139 meq/L (ref 135–145)
Total Protein: 7.9 g/dL (ref 6.0–8.3)

## 2017-02-24 LAB — CBC
HCT: 45.8 % (ref 39.0–52.0)
Hemoglobin: 15.2 g/dL (ref 13.0–17.0)
MCHC: 33.3 g/dL (ref 30.0–36.0)
MCV: 89.7 fl (ref 78.0–100.0)
PLATELETS: 335 10*3/uL (ref 150.0–400.0)
RBC: 5.1 Mil/uL (ref 4.22–5.81)
RDW: 13.6 % (ref 11.5–15.5)
WBC: 9.2 10*3/uL (ref 4.0–10.5)

## 2017-02-24 LAB — TSH: TSH: 2.24 u[IU]/mL (ref 0.35–4.50)

## 2017-02-24 LAB — SEDIMENTATION RATE: Sed Rate: 7 mm/hr (ref 0–15)

## 2017-02-24 NOTE — Patient Instructions (Addendum)
It was nice to see you again today!   I will be in touch with your labs asap I think chances are that you are in good health, but we will look for anything amiss. Certainly your headache may be worsened by caffeine withdrawal- you might try decreasing your intake more gradually to minimize symptoms.    Be sure to get your flu shot soon!

## 2018-09-05 ENCOUNTER — Encounter (HOSPITAL_COMMUNITY): Payer: Self-pay | Admitting: Emergency Medicine

## 2018-09-05 ENCOUNTER — Observation Stay (HOSPITAL_COMMUNITY): Payer: No Typology Code available for payment source | Admitting: Anesthesiology

## 2018-09-05 ENCOUNTER — Other Ambulatory Visit: Payer: Self-pay

## 2018-09-05 ENCOUNTER — Encounter (HOSPITAL_COMMUNITY)
Admission: EM | Disposition: A | Discharge status: Home / Self Care | Payer: Self-pay | Source: Home / Self Care | Attending: General Surgery

## 2018-09-05 ENCOUNTER — Emergency Department (HOSPITAL_COMMUNITY): Payer: No Typology Code available for payment source

## 2018-09-05 ENCOUNTER — Observation Stay (HOSPITAL_COMMUNITY): Payer: No Typology Code available for payment source

## 2018-09-05 ENCOUNTER — Inpatient Hospital Stay (HOSPITAL_COMMUNITY)
Admission: EM | Admit: 2018-09-05 | Discharge: 2018-09-08 | DRG: 357 | Disposition: A | Discharge status: Home / Self Care | Payer: No Typology Code available for payment source | Attending: General Surgery | Admitting: General Surgery

## 2018-09-05 DIAGNOSIS — K660 Peritoneal adhesions (postprocedural) (postinfection): Secondary | ICD-10-CM | POA: Diagnosis present

## 2018-09-05 DIAGNOSIS — Z0189 Encounter for other specified special examinations: Secondary | ICD-10-CM

## 2018-09-05 DIAGNOSIS — K458 Other specified abdominal hernia without obstruction or gangrene: Secondary | ICD-10-CM | POA: Diagnosis not present

## 2018-09-05 DIAGNOSIS — K460 Unspecified abdominal hernia with obstruction, without gangrene: Principal | ICD-10-CM | POA: Diagnosis present

## 2018-09-05 DIAGNOSIS — Z803 Family history of malignant neoplasm of breast: Secondary | ICD-10-CM

## 2018-09-05 DIAGNOSIS — K567 Ileus, unspecified: Secondary | ICD-10-CM | POA: Diagnosis not present

## 2018-09-05 DIAGNOSIS — Z1159 Encounter for screening for other viral diseases: Secondary | ICD-10-CM

## 2018-09-05 DIAGNOSIS — Z5331 Laparoscopic surgical procedure converted to open procedure: Secondary | ICD-10-CM

## 2018-09-05 DIAGNOSIS — K219 Gastro-esophageal reflux disease without esophagitis: Secondary | ICD-10-CM | POA: Diagnosis present

## 2018-09-05 DIAGNOSIS — Z8261 Family history of arthritis: Secondary | ICD-10-CM

## 2018-09-05 DIAGNOSIS — R0602 Shortness of breath: Secondary | ICD-10-CM | POA: Diagnosis not present

## 2018-09-05 DIAGNOSIS — R1012 Left upper quadrant pain: Secondary | ICD-10-CM

## 2018-09-05 DIAGNOSIS — T402X5A Adverse effect of other opioids, initial encounter: Secondary | ICD-10-CM | POA: Diagnosis not present

## 2018-09-05 DIAGNOSIS — R0789 Other chest pain: Secondary | ICD-10-CM | POA: Diagnosis not present

## 2018-09-05 DIAGNOSIS — Z8249 Family history of ischemic heart disease and other diseases of the circulatory system: Secondary | ICD-10-CM | POA: Diagnosis not present

## 2018-09-05 DIAGNOSIS — Z01818 Encounter for other preprocedural examination: Secondary | ICD-10-CM

## 2018-09-05 DIAGNOSIS — K56691 Other complete intestinal obstruction: Secondary | ICD-10-CM | POA: Diagnosis not present

## 2018-09-05 HISTORY — PX: LAPAROSCOPY: SHX197

## 2018-09-05 LAB — COMPREHENSIVE METABOLIC PANEL
ALT: 24 U/L (ref 0–44)
AST: 25 U/L (ref 15–41)
Albumin: 5.3 g/dL — ABNORMAL HIGH (ref 3.5–5.0)
Alkaline Phosphatase: 81 U/L (ref 38–126)
Anion gap: 15 (ref 5–15)
BUN: 17 mg/dL (ref 6–20)
CO2: 24 mmol/L (ref 22–32)
Calcium: 9.7 mg/dL (ref 8.9–10.3)
Chloride: 102 mmol/L (ref 98–111)
Creatinine, Ser: 1.04 mg/dL (ref 0.61–1.24)
GFR calc Af Amer: >60 mL/min (ref >60)
GFR calc non Af Amer: >60 mL/min (ref >60)
Glucose, Bld: 142 mg/dL — ABNORMAL HIGH (ref 70–99)
Potassium: 4 mmol/L (ref 3.5–5.1)
Sodium: 141 mmol/L (ref 135–145)
Total Bilirubin: 1.2 mg/dL (ref 0.3–1.2)
Total Protein: 8 g/dL (ref 6.5–8.1)

## 2018-09-05 LAB — CBC WITH DIFFERENTIAL/PLATELET
Abs Immature Granulocytes: 0.04 10*3/uL (ref 0.00–0.07)
Basophils Absolute: 0.1 10*3/uL (ref 0.0–0.1)
Basophils Relative: 0 %
Eosinophils Absolute: 0 10*3/uL (ref 0.0–0.5)
Eosinophils Relative: 0 %
HCT: 44.5 % (ref 39.0–52.0)
Hemoglobin: 15.6 g/dL (ref 13.0–17.0)
Immature Granulocytes: 0 %
Lymphocytes Relative: 9 %
Lymphs Abs: 1.3 10*3/uL (ref 0.7–4.0)
MCH: 30.2 pg (ref 26.0–34.0)
MCHC: 35.1 g/dL (ref 30.0–36.0)
MCV: 86.2 fL (ref 80.0–100.0)
Monocytes Absolute: 0.5 10*3/uL (ref 0.1–1.0)
Monocytes Relative: 4 %
Neutro Abs: 11.7 10*3/uL — ABNORMAL HIGH (ref 1.7–7.7)
Neutrophils Relative %: 87 %
Platelets: 287 10*3/uL (ref 150–400)
RBC: 5.16 MIL/uL (ref 4.22–5.81)
RDW: 12.9 % (ref 11.5–15.5)
WBC: 13.6 10*3/uL — ABNORMAL HIGH (ref 4.0–10.5)
nRBC: 0 % (ref 0.0–0.2)

## 2018-09-05 LAB — SARS CORONAVIRUS 2 BY RT PCR (HOSPITAL ORDER, PERFORMED IN ~~LOC~~ HOSPITAL LAB): SARS Coronavirus 2: NEGATIVE

## 2018-09-05 LAB — LIPASE, BLOOD: Lipase: 20 U/L (ref 11–51)

## 2018-09-05 LAB — SURGICAL PCR SCREEN
MRSA, PCR: NEGATIVE
Staphylococcus aureus: POSITIVE — AB

## 2018-09-05 SURGERY — LAPAROSCOPY, DIAGNOSTIC
Anesthesia: General

## 2018-09-05 MED ORDER — KETOROLAC TROMETHAMINE 30 MG/ML IJ SOLN
30.0000 mg | Freq: Four times a day (QID) | INTRAMUSCULAR | Status: DC
  Administered 2018-09-05 – 2018-09-08 (×10): 30 mg via INTRAVENOUS
  Filled 2018-09-05 (×10): qty 1

## 2018-09-05 MED ORDER — KETOROLAC TROMETHAMINE 30 MG/ML IJ SOLN
30.0000 mg | Freq: Four times a day (QID) | INTRAMUSCULAR | Status: AC
  Administered 2018-09-05: 30 mg via INTRAVENOUS
  Filled 2018-09-05: qty 1

## 2018-09-05 MED ORDER — BUPIVACAINE LIPOSOME 1.3 % IJ SUSP
INTRAMUSCULAR | Status: DC | PRN
  Administered 2018-09-05: 20 mL

## 2018-09-05 MED ORDER — ENOXAPARIN SODIUM 40 MG/0.4ML ~~LOC~~ SOLN
40.0000 mg | SUBCUTANEOUS | Status: DC
  Administered 2018-09-06 – 2018-09-08 (×3): 40 mg via SUBCUTANEOUS
  Filled 2018-09-05 (×3): qty 0.4

## 2018-09-05 MED ORDER — IOHEXOL 300 MG/ML  SOLN
100.0000 mL | Freq: Once | INTRAMUSCULAR | Status: AC | PRN
  Administered 2018-09-05: 100 mL via INTRAVENOUS

## 2018-09-05 MED ORDER — CHLORHEXIDINE GLUCONATE CLOTH 2 % EX PADS: 6.0000 | MEDICATED_PAD | Freq: Once | CUTANEOUS | Status: DC

## 2018-09-05 MED ORDER — SUCCINYLCHOLINE CHLORIDE 200 MG/10ML IV SOSY
PREFILLED_SYRINGE | INTRAVENOUS | Status: AC
  Filled 2018-09-05: qty 10

## 2018-09-05 MED ORDER — SUGAMMADEX SODIUM 200 MG/2ML IV SOLN
INTRAVENOUS | Status: DC | PRN
  Administered 2018-09-05 (×2): 100 mg via INTRAVENOUS

## 2018-09-05 MED ORDER — PROPOFOL 10 MG/ML IV BOLUS
INTRAVENOUS | Status: DC | PRN
  Administered 2018-09-05: 170 mg via INTRAVENOUS

## 2018-09-05 MED ORDER — 0.9 % SODIUM CHLORIDE (POUR BTL) OPTIME
TOPICAL | Status: DC | PRN
  Administered 2018-09-05 (×3): 1000 mL

## 2018-09-05 MED ORDER — BUPIVACAINE LIPOSOME 1.3 % IJ SUSP
INTRAMUSCULAR | Status: AC
  Filled 2018-09-05: qty 20

## 2018-09-05 MED ORDER — METOPROLOL TARTRATE 5 MG/5ML IV SOLN: 5.0000 mg | Freq: Four times a day (QID) | INTRAVENOUS | Status: DC | PRN

## 2018-09-05 MED ORDER — PROPOFOL 10 MG/ML IV BOLUS
INTRAVENOUS | Status: AC
  Filled 2018-09-05: qty 40

## 2018-09-05 MED ORDER — SODIUM CHLORIDE 0.9 % IV BOLUS
1000.0000 mL | Freq: Once | INTRAVENOUS | Status: AC
  Administered 2018-09-05: 1000 mL via INTRAVENOUS

## 2018-09-05 MED ORDER — BUPIVACAINE HCL (PF) 0.5 % IJ SOLN
INTRAMUSCULAR | Status: AC
  Filled 2018-09-05: qty 30

## 2018-09-05 MED ORDER — ACETAMINOPHEN 500 MG PO TABS
1000.0000 mg | ORAL_TABLET | Freq: Four times a day (QID) | ORAL | Status: DC
  Administered 2018-09-05 – 2018-09-08 (×11): 1000 mg via ORAL
  Filled 2018-09-05 (×13): qty 2

## 2018-09-05 MED ORDER — ONDANSETRON HCL 4 MG/2ML IJ SOLN
INTRAMUSCULAR | Status: AC
  Filled 2018-09-05: qty 2

## 2018-09-05 MED ORDER — HYDROMORPHONE HCL 1 MG/ML IJ SOLN: 0.5000 mg | INTRAMUSCULAR | Status: DC | PRN

## 2018-09-05 MED ORDER — PROMETHAZINE HCL 25 MG/ML IJ SOLN: 6.2500 mg | INTRAMUSCULAR | Status: DC | PRN

## 2018-09-05 MED ORDER — ONDANSETRON HCL 4 MG/2ML IJ SOLN
4.0000 mg | Freq: Once | INTRAMUSCULAR | Status: AC
  Administered 2018-09-05: 02:00:00 4 mg via INTRAVENOUS
  Filled 2018-09-05: qty 2

## 2018-09-05 MED ORDER — HYDROMORPHONE HCL 1 MG/ML IJ SOLN
0.5000 mg | Freq: Once | INTRAMUSCULAR | Status: AC
  Administered 2018-09-05: 02:00:00 0.5 mg via INTRAVENOUS
  Filled 2018-09-05: qty 1

## 2018-09-05 MED ORDER — CIPROFLOXACIN IN D5W 400 MG/200ML IV SOLN
400.0000 mg | INTRAVENOUS | Status: AC
  Administered 2018-09-05: 400 mg via INTRAVENOUS
  Filled 2018-09-05: qty 200

## 2018-09-05 MED ORDER — MIDAZOLAM HCL 5 MG/5ML IJ SOLN
INTRAMUSCULAR | Status: DC | PRN
  Administered 2018-09-05: 2 mg via INTRAVENOUS

## 2018-09-05 MED ORDER — ONDANSETRON HCL 4 MG/2ML IJ SOLN
4.0000 mg | Freq: Once | INTRAMUSCULAR | Status: AC
  Administered 2018-09-05: 04:00:00 4 mg via INTRAVENOUS
  Filled 2018-09-05: qty 2

## 2018-09-05 MED ORDER — ONDANSETRON HCL 4 MG/2ML IJ SOLN: 4.0000 mg | Freq: Four times a day (QID) | INTRAMUSCULAR | Status: DC | PRN

## 2018-09-05 MED ORDER — SUCCINYLCHOLINE CHLORIDE 20 MG/ML IJ SOLN
INTRAMUSCULAR | Status: DC | PRN
  Administered 2018-09-05: 175 mg via INTRAVENOUS

## 2018-09-05 MED ORDER — ONDANSETRON HCL 4 MG/2ML IJ SOLN
INTRAMUSCULAR | Status: DC | PRN
  Administered 2018-09-05: 4 mg via INTRAVENOUS

## 2018-09-05 MED ORDER — CHLORHEXIDINE GLUCONATE CLOTH 2 % EX PADS
6.0000 | MEDICATED_PAD | Freq: Once | CUTANEOUS | Status: AC
  Administered 2018-09-05: 06:00:00 6 via TOPICAL

## 2018-09-05 MED ORDER — SUGAMMADEX SODIUM 200 MG/2ML IV SOLN
INTRAVENOUS | Status: AC
  Filled 2018-09-05: qty 2

## 2018-09-05 MED ORDER — ROCURONIUM BROMIDE 10 MG/ML (PF) SYRINGE
PREFILLED_SYRINGE | INTRAVENOUS | Status: AC
  Filled 2018-09-05: qty 10

## 2018-09-05 MED ORDER — LACTATED RINGERS IV SOLN
INTRAVENOUS | Status: DC
  Administered 2018-09-05 – 2018-09-07 (×6): via INTRAVENOUS

## 2018-09-05 MED ORDER — HYDROMORPHONE HCL 1 MG/ML IJ SOLN
0.5000 mg | Freq: Once | INTRAMUSCULAR | Status: AC
  Administered 2018-09-05: 0.5 mg via INTRAVENOUS
  Filled 2018-09-05: qty 1

## 2018-09-05 MED ORDER — KETOROLAC TROMETHAMINE 30 MG/ML IJ SOLN: 30.0000 mg | Freq: Four times a day (QID) | INTRAMUSCULAR | Status: DC | PRN

## 2018-09-05 MED ORDER — LACTATED RINGERS IV SOLN
INTRAVENOUS | Status: DC
  Administered 2018-09-05: 08:00:00 via INTRAVENOUS

## 2018-09-05 MED ORDER — PANTOPRAZOLE SODIUM 40 MG IV SOLR
40.0000 mg | Freq: Every day | INTRAVENOUS | Status: DC
  Administered 2018-09-05 – 2018-09-07 (×3): 40 mg via INTRAVENOUS
  Filled 2018-09-05 (×3): qty 40

## 2018-09-05 MED ORDER — ONDANSETRON 4 MG PO TBDP: 4.0000 mg | ORAL_TABLET | Freq: Four times a day (QID) | ORAL | Status: DC | PRN

## 2018-09-05 MED ORDER — CIPROFLOXACIN IN D5W 400 MG/200ML IV SOLN
INTRAVENOUS | Status: AC
  Filled 2018-09-05: qty 200

## 2018-09-05 MED ORDER — FENTANYL CITRATE (PF) 100 MCG/2ML IJ SOLN
INTRAMUSCULAR | Status: DC | PRN
  Administered 2018-09-05: 50 ug via INTRAVENOUS
  Administered 2018-09-05: 100 ug via INTRAVENOUS
  Administered 2018-09-05 (×2): 50 ug via INTRAVENOUS

## 2018-09-05 MED ORDER — ROCURONIUM BROMIDE 100 MG/10ML IV SOLN
INTRAVENOUS | Status: DC | PRN
  Administered 2018-09-05: 25 mg via INTRAVENOUS
  Administered 2018-09-05: 20 mg via INTRAVENOUS
  Administered 2018-09-05: 10 mg via INTRAVENOUS
  Administered 2018-09-05: 20 mg via INTRAVENOUS
  Administered 2018-09-05: 10 mg via INTRAVENOUS
  Administered 2018-09-05: 30 mg via INTRAVENOUS
  Administered 2018-09-05: 5 mg via INTRAVENOUS

## 2018-09-05 MED ORDER — FENTANYL CITRATE (PF) 250 MCG/5ML IJ SOLN
INTRAMUSCULAR | Status: AC
  Filled 2018-09-05: qty 5

## 2018-09-05 MED ORDER — MIDAZOLAM HCL 2 MG/2ML IJ SOLN
INTRAMUSCULAR | Status: AC
  Filled 2018-09-05: qty 2

## 2018-09-05 MED ORDER — SODIUM CHLORIDE 0.9 % IR SOLN
Status: DC | PRN
  Administered 2018-09-05: 3000 mL

## 2018-09-05 MED ORDER — MIDAZOLAM HCL 2 MG/2ML IJ SOLN: 0.5000 mg | Freq: Once | INTRAMUSCULAR | Status: DC | PRN

## 2018-09-05 MED ORDER — ENOXAPARIN SODIUM 40 MG/0.4ML ~~LOC~~ SOLN: 40.0000 mg | SUBCUTANEOUS | Status: DC

## 2018-09-05 MED ORDER — MORPHINE SULFATE (PF) 2 MG/ML IV SOLN: 2.0000 mg | INTRAVENOUS | Status: DC | PRN

## 2018-09-05 MED ORDER — FENTANYL CITRATE (PF) 100 MCG/2ML IJ SOLN
INTRAMUSCULAR | Status: AC
  Filled 2018-09-05: qty 2

## 2018-09-05 SURGICAL SUPPLY — 62 items
ADH SKN CLS APL DERMABOND .7 (GAUZE/BANDAGES/DRESSINGS)
APL PRP STRL LF DISP 70% ISPRP (MISCELLANEOUS) ×1
APPLIER CLIP 13 LRG OPEN (CLIP) ×3
APR CLP LRG 13 20 CLIP (CLIP) ×1
BLADE SURG 15 STRL LF DISP TIS (BLADE) ×1 IMPLANT
BLADE SURG 15 STRL SS (BLADE) ×3
CATH ROBINSON RED A/P 14FR (CATHETERS) ×3 IMPLANT
CELLS DAT CNTRL 66122 CELL SVR (MISCELLANEOUS) ×1 IMPLANT
CHLORAPREP W/TINT 26 (MISCELLANEOUS) ×3 IMPLANT
CLIP APPLIE 13 LRG OPEN (CLIP) IMPLANT
CLOTH BEACON ORANGE TIMEOUT ST (SAFETY) ×3 IMPLANT
CONNECTOR 5 IN 1 STRAIGHT STRL (MISCELLANEOUS) ×2 IMPLANT
COVER LIGHT HANDLE STERIS (MISCELLANEOUS) ×6 IMPLANT
COVER WAND RF STERILE (DRAPES) ×2 IMPLANT
DECANTER SPIKE VIAL GLASS SM (MISCELLANEOUS) ×3 IMPLANT
DERMABOND ADVANCED (GAUZE/BANDAGES/DRESSINGS)
DERMABOND ADVANCED .7 DNX12 (GAUZE/BANDAGES/DRESSINGS) IMPLANT
DISSECTOR BLUNT TIP ENDO 5MM (MISCELLANEOUS) ×2 IMPLANT
DRSG OPSITE POSTOP 4X8 (GAUZE/BANDAGES/DRESSINGS) ×2 IMPLANT
DRSG TEGADERM 2-3/8X2-3/4 SM (GAUZE/BANDAGES/DRESSINGS) ×4 IMPLANT
ELECT REM PT RETURN 9FT ADLT (ELECTROSURGICAL) ×3
ELECTRODE REM PT RTRN 9FT ADLT (ELECTROSURGICAL) ×1 IMPLANT
GAUZE SPONGE 4X4 12PLY STRL (GAUZE/BANDAGES/DRESSINGS) ×2 IMPLANT
GLOVE BIO SURGEON STRL SZ 6.5 (GLOVE) ×2 IMPLANT
GLOVE BIO SURGEONS STRL SZ 6.5 (GLOVE) ×1
GLOVE BIOGEL M 7.0 STRL (GLOVE) ×2 IMPLANT
GLOVE BIOGEL PI IND STRL 6.5 (GLOVE) ×1 IMPLANT
GLOVE BIOGEL PI IND STRL 7.0 (GLOVE) ×3 IMPLANT
GLOVE BIOGEL PI INDICATOR 6.5 (GLOVE) ×2
GLOVE BIOGEL PI INDICATOR 7.0 (GLOVE) ×4
GOWN STRL REUS W/TWL LRG LVL3 (GOWN DISPOSABLE) ×9 IMPLANT
HANDLE SUCTION POOLE (INSTRUMENTS) IMPLANT
INST SET LAPROSCOPIC AP (KITS) ×3 IMPLANT
IV NS IRRIG 3000ML ARTHROMATIC (IV SOLUTION) ×2 IMPLANT
KIT TURNOVER KIT A (KITS) ×3 IMPLANT
MANIFOLD NEPTUNE II (INSTRUMENTS) ×3 IMPLANT
NDL INSUFFLATION 14GA 120MM (NEEDLE) ×1 IMPLANT
NEEDLE INSUFFLATION 14GA 120MM (NEEDLE) ×3 IMPLANT
NS IRRIG 1000ML POUR BTL (IV SOLUTION) ×9 IMPLANT
PACK LAP CHOLE LZT030E (CUSTOM PROCEDURE TRAY) ×3 IMPLANT
PAD ARMBOARD 7.5X6 YLW CONV (MISCELLANEOUS) ×3 IMPLANT
PENCIL HANDSWITCHING (ELECTRODE) ×2 IMPLANT
RETRACTOR WND ALEXIS 18 MED (MISCELLANEOUS) IMPLANT
RTRCTR WOUND ALEXIS 18CM MED (MISCELLANEOUS) ×3
SET BASIN LINEN APH (SET/KITS/TRAYS/PACK) ×3 IMPLANT
SET TUBE IRRIG SUCTION NO TIP (IRRIGATION / IRRIGATOR) ×3 IMPLANT
SHEARS HARMONIC ACE PLUS 36CM (ENDOMECHANICALS) ×2 IMPLANT
SLEEVE ENDOPATH XCEL 5M (ENDOMECHANICALS) ×4 IMPLANT
SPONGE LAP 18X18 RF (DISPOSABLE) ×6 IMPLANT
STAPLER VISISTAT (STAPLE) ×3 IMPLANT
SUCTION POOLE HANDLE (INSTRUMENTS) ×3
SUT PDS AB CT VIOLET #0 27IN (SUTURE) ×4 IMPLANT
SUT SILK 3 0 SH CR/8 (SUTURE) ×4 IMPLANT
SUT VIC AB 3-0 SH 27 (SUTURE) ×3
SUT VIC AB 3-0 SH 27X BRD (SUTURE) ×1 IMPLANT
SYR 30ML LL (SYRINGE) ×3 IMPLANT
SYRINGE 20CC LL (MISCELLANEOUS) ×2 IMPLANT
TROCAR ENDO BLADELESS 11MM (ENDOMECHANICALS) ×3 IMPLANT
TROCAR XCEL NON-BLD 5MMX100MML (ENDOMECHANICALS) ×3 IMPLANT
TROCAR XCEL UNIV SLVE 11M 100M (ENDOMECHANICALS) ×3 IMPLANT
TUBING INSUF HEATED (TUBING) ×3 IMPLANT
WARMER LAPAROSCOPE (MISCELLANEOUS) ×3 IMPLANT

## 2018-09-05 NOTE — Progress Notes (Signed)
Patient continues to rest in bed with minimal complaints of pain. Patient refuses offers of pain medications, stating "ill tough it out". NG tube is on low intermittent wall suction. Patient offers no other complaints at this time.

## 2018-09-05 NOTE — Progress Notes (Signed)
[facility]  Spoke with patient's mother Maralyn Sago regarding surgery. Need for open surgery. Plan for admission for a few days. She has number to 3rd floor.  Algis Greenhouse, MD [facility] [address] [place], [place] [address] [phone] (office)

## 2018-09-05 NOTE — ED Notes (Signed)
Per xray pt vomited in ct.

## 2018-09-05 NOTE — Anesthesia Preprocedure Evaluation (Signed)
Anesthesia Evaluation  Patient identified by MRN, date of birth, ID band Patient awake    Reviewed: Allergy & Precautions, NPO status , Patient's Chart, lab work & pertinent test results  Airway Mallampati: II  TM Distance: >3 FB Neck ROM: Full    Dental no notable dental hx. (+) Teeth Intact   Pulmonary neg pulmonary ROS,  Denies SOB or any breathing issues    Pulmonary exam normal breath sounds clear to auscultation       Cardiovascular Exercise Tolerance: Good negative cardio ROS Normal cardiovascular examI Rhythm:Regular Rate:Normal     Neuro/Psych negative neurological ROS  negative psych ROS   GI/Hepatic Neg liver ROS, GERD  Controlled,States only PRN meds    Endo/Other  negative endocrine ROS  Renal/GU negative Renal ROS  negative genitourinary   Musculoskeletal negative musculoskeletal ROS (+)   Abdominal   Peds negative pediatric ROS (+)  Hematology negative hematology ROS (+)   Anesthesia Other Findings   Reproductive/Obstetrics negative OB ROS                             Anesthesia Physical Anesthesia Plan  ASA: II  Anesthesia Plan: General   Post-op Pain Management:    Induction: Cricoid pressure planned  PONV Risk Score and Plan:   Airway Management Planned: Oral ETT  Additional Equipment:   Intra-op Plan:   Post-operative Plan: Extubation in OR  Informed Consent: I have reviewed the patients History and Physical, chart, labs and discussed the procedure including the risks, benefits and alternatives for the proposed anesthesia with the patient or authorized representative who has indicated his/her understanding and acceptance.     Dental advisory given  Plan Discussed with: CRNA  Anesthesia Plan Comments: (Full PPE use planned  GETA d/w pt -+ Q&A- WTP with same )        Anesthesia Quick Evaluation

## 2018-09-05 NOTE — Anesthesia Procedure Notes (Signed)
Procedure Name: Intubation Date/Time: 09/05/2018 7:49 AM Performed by: Charmaine Downs, CRNA Pre-anesthesia Checklist: Patient identified, Emergency Drugs available, Suction available and Patient being monitored Patient Re-evaluated:Patient Re-evaluated prior to induction Oxygen Delivery Method: Circle system utilized Preoxygenation: Pre-oxygenation with 100% oxygen Induction Type: IV induction, Rapid sequence and Cricoid Pressure applied Laryngoscope Size: Mac and 4 Grade View: Grade II Number of attempts: 1 Airway Equipment and Method: Stylet Placement Confirmation: ETT inserted through vocal cords under direct vision and positive ETCO2 Secured at: 22 cm Tube secured with: Tape Dental Injury: Teeth and Oropharynx as per pre-operative assessment and Bloody posterior oropharynx  Comments: Bloody posterior oropharynx noted prior to laryngoscopy.Appears to be from NGT insertion prior to OR arrival.

## 2018-09-05 NOTE — Progress Notes (Signed)
Pt leaving unit to surgery with PACU staff. Pt alert and oriented and aware of situation. Will sign consent once he speaks with Dr. Henreitta Leber regarding surgery.

## 2018-09-05 NOTE — Transfer of Care (Signed)
Immediate Anesthesia Transfer of Care Note  Patient: Ryan Dougherty  Procedure(s) Performed: LAPAROSCOPY DIAGNOSTIC CONVERTED TO LAPAROTOMY (AT 0848) WITH REDUCTION OF INTERNAL HERNIA  AND CLOSURE OF DEFECT (N/A )  Patient Location: PACU  Anesthesia Type:General  Level of Consciousness: drowsy and patient cooperative  Airway & Oxygen Therapy: Patient Spontanous Breathing and Patient connected to face mask oxygen  Post-op Assessment: Report given to RN, Post -op Vital signs reviewed and stable and Patient moving all extremities  Post vital signs: Reviewed and stable  Last Vitals:  Vitals Value Taken Time  BP    Temp    Pulse 93 09/05/2018 10:08 AM  Resp 21 09/05/2018 10:08 AM  SpO2 98 % 09/05/2018 10:08 AM  Vitals shown include unvalidated device data.  Last Pain:  Vitals:   09/05/18 0722  TempSrc:   PainSc: 4       Patients Stated Pain Goal: 8 (09/05/18 4628)  Complications: No apparent anesthesia complications

## 2018-09-05 NOTE — Progress Notes (Addendum)
[facility]  Spoke with Dr. Estell Harpin. Patient with nausea and vomiting and abdominal pain. CT with concern for possible internal hernia. No prior surgeries. No fever, cough, SOB symptoms related to COVID 19.  Slight WBC but no signs of mesenteric edema or thickening of the bowel to suggest compromise.   Placing in observation and will plan to perform a diagnostic laparoscopy possible laparotomy at 730AM.   H&P to follow.  Orders placed NG now and fluids NPO CXR   Algis Greenhouse, MD [facility] [address] [place], [place] [address] [phone] (office)

## 2018-09-05 NOTE — Progress Notes (Signed)
Patient arrived to floor via bed and is alert and oriented. Incision is CDI and abdominal binder is applied and patient is resting comfortably in the bed.  NG tube in place and SCDs are on. Patient offers no complaints at this time.

## 2018-09-05 NOTE — Progress Notes (Signed)
Spoke to patient mother about surgery that would be taking place today at 0730. Patient mother verbalized understanding about surgery and stated she would be calling back.   Patient refused to sign consent until surgery discussed with doctor.

## 2018-09-05 NOTE — Anesthesia Postprocedure Evaluation (Signed)
Anesthesia Post Note  Patient: Tay Huwe Trimarco  Procedure(s) Performed: LAPAROSCOPY DIAGNOSTIC CONVERTED TO LAPAROTOMY (AT 0848) WITH REDUCTION OF INTERNAL HERNIA  AND CLOSURE OF DEFECT (N/A )  Patient location during evaluation: PACU Anesthesia Type: General Level of consciousness: awake and patient cooperative Pain management: pain level controlled Vital Signs Assessment: post-procedure vital signs reviewed and stable Respiratory status: spontaneous breathing, nonlabored ventilation and respiratory function stable Cardiovascular status: blood pressure returned to baseline Postop Assessment: no apparent nausea or vomiting Anesthetic complications: no     Last Vitals:  Vitals:   09/05/18 1015 09/05/18 1030  BP: (!) 141/90 136/81  Pulse: 95 71  Resp: 15 18  Temp:    SpO2: 98% 97%    Last Pain:  Vitals:   09/05/18 1010  TempSrc:   PainSc: Asleep                 Nemesis Rainwater J

## 2018-09-05 NOTE — Op Note (Signed)
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 Henreitta LeberBridges, MD   Assistants: No qualified resident was available    Anesthesia: General endotracheal   Anesthesiologist: Dorena CookeyNabonsal, Jeff S, MD    Specimens:  None    Estimated Blood Loss: Minimal   Blood Replacement: None    Complications: None   Wound Class: Clean    Operative Indications:  [name] is a 28 yo who had acute onset of abdominal pain with associated nausea and vomiting and was found to have concern for a closed loop small bowel obstruction on CT possibly from an internal hernia. Due to the risk of bowel compromise, we discussed the option of exploration and the risk and benefits including but not limited to bleeding, infection, injury to other organs, injury to bowel, need for bowel resection, and possible open exploration.  [name] understood these risks and opted to proceed.   Findings: Internal hernia through a mesenteric defect with closed loop obstruction; loops of small bowel in the defect had a clear casing almost like a hernia sac around them indicating this likely has been an issue for some time            Procedure: The patient was taken to the operating room and placed supine. General endotracheal anesthesia was induced. Intravenous antibiotics were administered per protocol.  A nasogastric tube was already positioned to decompress the stomach. The abdomen was prepared and draped in the usual sterile fashion.   We started out laparoscopically. A supraumbilical incision  was made and a towel clip was used to elevated the peritoneum. The Veress needle was inserted and the saline drop test with low insufflation pressures indicated entry into the peritoneum.  A 11 mm optiview trocar was placed under direct visualization.  The abdomen was inspected and the dilated loops of bowel in the left side of the abdomen were noted and there appeared to be a clear coating/ casing around them.  Two 5 mm trocars were placed under direct visualization in the left lower quadrant and suprapubic area. The patient was placed in Trendelenburg position with the right side up.  The small bowel was ran from the terminal ileum. The terminal ileum and distal bowel were healthy and decompressed. I reached a point in running the small bowel were I discovered a mesenteric defect that the bowel entered close to the origin of the vessels.  I placed 3rd 5 mm trocar in the right mid abdomen and moved to the right side to further assess the defect given its location.  It appeared that there was some adhesions and a large fibrous band tethering the small bowel in the defect.  I was able to lysis some of these with the harmonic scalpel and scissors, and reduced a portion of the small bowel. There were clearly two loops of bowel coming out of the defect indicating the closed loop obstruction.  After manipulating the small bowel and reducing as much as a could safely, I was afraid of making an enterotomy as a few serosal tears had occurred despite careful handling of the bowel.  The bowel had significant venous  and lymphatic engorgement also.  I decided to convert to open.  The trocars were removed and the midline was extended. A wound protector was placed.  I identified the mesenteric defect and the loops of small bowel and was able to finish reducing the bowel.  I identified the ligament of treitz proximally and the defect was just adjacent to these area with a significant portion of bowel in the hernia.  The mesenteric  defect was closed with interrupted 3-0 Vicryl, ensuring that no blood vessels were compromised.  The fibrous band that I had taken down with the harmonic scalpel was overlying the aorta and was hemostatic but given the proximity and possible collateral vessels two large clips were placed across this band.  The bowel was ran and the serosal tears were oversewn with 3-0 Silk suture in a Lembert fashion. The bowel was placed in anatomic alignment and warm irrigation was used. The bowel was pink and healthy at the completion of the case.  The abdomen was closed with 0 PDS suture and Xparel was placed.  The skin of the midline and the remaining 5 mm trocar sites were closed with staples.  The patient was I&O at the end of the case due to the length of the case. He had voided just prior to coming back to the OR.   Final inspection revealed acceptable hemostasis. All counts were correct at the end of the case. The patient was awakened from anesthesia and extubate without complication.  The patient went to the PACU in stable condition.   Algis Greenhouse, MD [facility] [address] [place] [place], [place] [address] [phone] (office)

## 2018-09-05 NOTE — H&P (Signed)
[facility] History and Physical  Reason for Referral: Small Bowel Obstruction; Possible Closed Loop Obstruction from Internal hernia  Referring Physician:  Dr. Estell Harpin   Chief Complaint    Abdominal Pain      Ryan Dougherty is a 28 y.o. male.  HPI:  Ryan Dougherty is a 28 yo with acute onset of abdominal pain in the LLQ. He has associated nausea and vomiting. He says the pain is achy n the LLQ.  He was seen in the ED with these symptoms and a CT scan was done that shows concern for a closed loop obstruction.  He has never had any surgery and says that he has had some pelvic pain in the past thought to be from lifting but nothing like this pain. He had a BM yesterday.     Past Medical History:  Diagnosis Date  . GERD (gastroesophageal reflux disease)     History reviewed. No pertinent surgical history.  Family History  Problem Relation Age of Onset  . Heart attack Maternal Grandfather 79  . Hypertension Maternal Grandfather   . Breast cancer Maternal Grandmother   . Arthritis Maternal Grandmother     Social History   Tobacco Use  . Smoking status: Never Smoker  . Smokeless tobacco: Never Used  Substance Use Topics  . Alcohol use: No  . Drug use: No    Medications:  I have reviewed the patient's current medications. Prior to Admission:  Medications Prior to Admission  Medication Sig Dispense Refill Last Dose  . Probiotic Product (PROBIOTIC DAILY PO) Take 1 capsule by mouth daily.   Unknown at Unknown time   Scheduled: . Chlorhexidine Gluconate Cloth  6 each Topical Once  . enoxaparin (LOVENOX) injection  40 mg Subcutaneous Q24H   Continuous: . ciprofloxacin    . lactated ringers 100 mL/hr at 09/05/18 0608   PRN:[MAR Hold]  HYDROmorphone (DILAUDID) injection, ondansetron **OR** ondansetron (ZOFRAN) IV Allergies  Allergen Reactions  . Penicillins Rash  . Codeine Rash     ROS:  A comprehensive review of systems was negative except for:  Gastrointestinal: positive for abdominal pain, nausea and vomiting  Blood pressure 128/71, pulse 82, temperature 98.7 F (37.1 C), temperature source Oral, resp. rate 14, height  (1.778 m), weight 73.9 kg, SpO2 100 %. Physical Exam Vitals signs reviewed.  Constitutional:      Appearance: He is well-developed.  HENT:     Head: Normocephalic.  Eyes:     Extraocular Movements: Extraocular movements intact.  Cardiovascular:     Rate and Rhythm: Normal rate and regular rhythm.  Pulmonary:     Effort: Pulmonary effort is normal.     Breath sounds: Normal breath sounds.  Abdominal:     General: There is distension.     Palpations: Abdomen is soft.     Tenderness: There is abdominal tenderness in the left lower quadrant. There is rebound. There is no guarding.  Skin:    General: Skin is warm and dry.  Neurological:     General: No focal deficit present.     Mental Status: He is alert and oriented to person, place, and time.  Psychiatric:        Mood and Affect: Mood normal.        Behavior: Behavior normal.     Results: Results for orders placed or performed during the hospital encounter of 09/05/18 (from the past 48 hour(s))  CBC with Differential/Platelet     Status: Abnormal  Collection Time: 09/05/18  1:42 AM  Result Value Ref Range   WBC 13.6 (H) 4.0 - 10.5 K/uL   RBC 5.16 4.22 - 5.81 MIL/uL   Hemoglobin 15.6 13.0 - 17.0 g/dL   HCT 16.1 09.6 - 04.5 %   MCV 86.2 80.0 - 100.0 fL   MCH 30.2 26.0 - 34.0 pg   MCHC 35.1 30.0 - 36.0 g/dL   RDW 40.9 81.1 - 91.4 %   Platelets 287 150 - 400 K/uL   nRBC 0.0 0.0 - 0.2 %   Neutrophils Relative % 87 %   Neutro Abs 11.7 (H) 1.7 - 7.7 K/uL   Lymphocytes Relative 9 %   Lymphs Abs 1.3 0.7 - 4.0 K/uL   Monocytes Relative 4 %   Monocytes Absolute 0.5 0.1 - 1.0 K/uL   Eosinophils Relative 0 %   Eosinophils Absolute 0.0 0.0 - 0.5 K/uL   Basophils Relative 0 %   Basophils Absolute 0.1 0.0 - 0.1 K/uL   Immature Granulocytes 0 %    Abs Immature Granulocytes 0.04 0.00 - 0.07 K/uL    Comment: Performed at [facility], [address] [place], [place] [address]  Comprehensive metabolic panel     Status: Abnormal   Collection Time: 09/05/18  1:42 AM  Result Value Ref Range   Sodium 141 135 - 145 mmol/L   Potassium 4.0 3.5 - 5.1 mmol/L   Chloride 102 98 - 111 mmol/L   CO2 24 22 - 32 mmol/L   Glucose, Bld 142 (H) 70 - 99 mg/dL   BUN 17 6 - 20 mg/dL   Creatinine, Ser 6.21 0.61 - 1.24 mg/dL   Calcium 9.7 8.9 - 30.8 mg/dL   Total Protein 8.0 6.5 - 8.1 g/dL   Albumin 5.3 (H) 3.5 - 5.0 g/dL   AST 25 15 - 41 U/L   ALT 24 0 - 44 U/L   Alkaline Phosphatase 81 38 - 126 U/L   Total Bilirubin 1.2 0.3 - 1.2 mg/dL   GFR calc non Af Amer >60 >60 mL/min   GFR calc Af Amer >60 >60 mL/min   Anion gap 15 5 - 15    Comment: Performed at [facility], [address] [place], [place] [address]  Lipase, blood     Status: None   Collection Time: 09/05/18  2:02 AM  Result Value Ref Range   Lipase 20 11 - 51 U/L    Comment: Performed at [facility], [address] [place], [place] [address]  SARS Coronavirus 2 (CEPHEID - Performed in [facility] hospital lab), Hosp Order     Status: None   Collection Time: 09/05/18  4:12 AM  Result Value Ref Range   SARS Coronavirus 2 NEGATIVE NEGATIVE    Comment: (NOTE) If result is NEGATIVE SARS-CoV-2 target nucleic acids are NOT DETECTED. The SARS-CoV-2 RNA is generally detectable in upper and lower  respiratory specimens during the acute phase of infection. The lowest  concentration of SARS-CoV-2 viral copies this assay can detect is 250  copies / mL. A negative result does not preclude SARS-CoV-2 infection  and should not be used as the sole basis for treatment or other  patient management decisions.  A negative result may occur with  improper specimen collection / handling, submission of specimen other  than nasopharyngeal swab, presence of viral mutation(s) within the  areas targeted by  this assay, and inadequate number of viral copies  (<250 copies / mL). A negative result must be combined  with clinical  observations, patient history, and epidemiological information. If result is POSITIVE SARS-CoV-2 target nucleic acids are DETECTED. The SARS-CoV-2 RNA is generally detectable in upper and lower  respiratory specimens dur ing the acute phase of infection.  Positive  results are indicative of active infection with SARS-CoV-2.  Clinical  correlation with patient history and other diagnostic information is  necessary to determine patient infection status.  Positive results do  not rule out bacterial infection or co-infection with other viruses. If result is PRESUMPTIVE POSTIVE SARS-CoV-2 nucleic acids MAY BE PRESENT.   A presumptive positive result was obtained on the submitted specimen  and confirmed on repeat testing.  While 2019 novel coronavirus  (SARS-CoV-2) nucleic acids may be present in the submitted sample  additional confirmatory testing may be necessary for epidemiological  and / or clinical management purposes  to differentiate between  SARS-CoV-2 and other Sarbecovirus currently known to infect humans.  If clinically indicated additional testing with an alternate test  methodology [phone] is advised. The SARS-CoV-2 RNA is generally  detectable in upper and lower respiratory sp ecimens during the acute  phase of infection. The expected result is Negative. Fact Sheet for Patients:  BoilerBrush.com.cyhttps://www.fda.gov/media/136312/download Fact Sheet for Healthcare Providers: https://pope.com/https://www.fda.gov/media/136313/download This test is not yet approved or cleared by the [place] FDA and has been authorized for detection and/or diagnosis of SARS-CoV-2 by FDA under an Emergency Use Authorization (EUA).  This EUA will remain in effect (meaning this test can be used) for the duration of the COVID-19 declaration under Section 564(b)(1) of the Act, 21 U.S.C. section  360bbb-3(b)(1), unless the authorization is terminated or revoked sooner. Performed at [facility], [address] [place], [place] [id number]    Personally reviewed CT- small bowel in the LLQ with dilation and proximal and distal decompressed bowel, no edema or thickening noted, area of concern where appearance of bowel entering a defect noted on coronal views   Ct Abdomen Pelvis W Contrast  Result Date: 09/05/2018 CLINICAL DATA:  Abdominal distention EXAM: CT ABDOMEN AND PELVIS WITH CONTRAST TECHNIQUE: Multidetector CT imaging of the abdomen and pelvis was performed using the standard protocol following bolus administration of intravenous contrast. CONTRAST:  100mL OMNIPAQUE IOHEXOL 300 MG/ML  SOLN COMPARISON:  Ultrasound 12/16/2015 FINDINGS: Lower chest: Lung bases are clear. No effusions. Heart is normal size. Hepatobiliary: No focal hepatic abnormality. Gallbladder unremarkable. Pancreas: No focal abnormality or ductal dilatation. Spleen: No focal abnormality.  Normal size. Adrenals/Urinary Tract: No adrenal abnormality. No focal renal abnormality. No stones or hydronephrosis. Urinary bladder is unremarkable. Stomach/Bowel: Dilated small bowel in the left abdomen. Stomach and proximal small bowel are decompressed. Distal small bowel also decompressed as is the colon. Findings compatible with small bowel obstruction. Given the appearance, closed loop obstruction related to internal hernia is possible. Vascular/Lymphatic: No evidence of aneurysm or adenopathy. Reproductive: No visible focal abnormality. Other: Trace free fluid in the cul-de-sac.  No free air. Musculoskeletal: No acute bony abnormality. IMPRESSION: Dilated fluid-filled small bowel loops in the left abdomen with decompressed small bowel proximal to and distal to these loops. Findings concerning for possible closed loop obstruction/internal hernia. Electronically Signed   By: Charlett NoseKevin  Dover M.D.   On: 09/05/2018 03:22   Dg Chest Port 1  View  Result Date: 09/05/2018 CLINICAL DATA:  Preop testing. EXAM: PORTABLE CHEST 1 VIEW COMPARISON:  01/27/2015 FINDINGS: Normal heart size and mediastinal contours. No acute infiltrate or edema. No effusion or pneumothorax. No acute osseous findings. IMPRESSION: Negative chest.  Electronically Signed   By: Marnee Spring M.D.   On: 09/05/2018 04:44   Dg Chest Port 1v Same Day  Result Date: 09/05/2018 CLINICAL DATA:  Nasogastric tube placement EXAM: PORTABLE CHEST 1 VIEW COMPARISON:  Earlier today FINDINGS: New nasogastric tube with tip at the fundus of the stomach. Lungs remain clear. Normal heart size. IMPRESSION: New nasogastric tube with tip at the gastric fundus. Electronically Signed   By: Marnee Spring M.D.   On: 09/05/2018 05:28     Assessment & Plan:  JORGEN MERRITT is a 28 y.o. male with a small bowel obstruction with concern for a closed loop obstruction from an internal hernia. He has never had surgery prior. Due to the concern for a closed loop and possible risk of bowwl compromise if this is not corrected will plan for surgery.   -Diagnostic laparoscopy possible open, possible bowel resection  -Discussed the risk and benefits including but not limited to bleeding, infection, injury to other organs, need for bowel resection, need for open procedure -Updated Mother, Ryan Dougherty via phone -no COVID 19 contacts, no cough, fever, SOB, COVID test negative and CXR wnl   -We have discussed the recent COVID 19 infections and risk associated with being in the hospital. We have discussed that purely elective surgeries are being canceled due to this infection, but that urgent and emergent cases are being performed on a case to case basis. The patient currently needs a diagnostic laparoscopy possible open and this falls under an emergency case due to risk of bowel compromise if a closed loop obstruction is not corrected .   After careful consideration, Ryan Dougherty has decided to proceed.     Lucretia Roers 09/05/2018, 7:14 AM

## 2018-09-05 NOTE — ED Provider Notes (Addendum)
[facility] EMERGENCY DEPARTMENT Provider Note   CSN: [id number] Arrival date & time: 09/05/18  0126    History   Chief Complaint Chief Complaint  Patient presents with  . Abdominal Pain    HPI Ryan Dougherty is a 28 y.o. male.     Patient presents with abdominal pain since yesterday with vomiting.  Patient complains of left lower quadrant abdominal pain  The history is provided by the patient.  Abdominal Pain  Pain location:  LLQ Pain quality: aching   Pain radiates to:  Does not radiate Pain severity:  No pain Onset quality:  Sudden Timing:  Constant Progression:  Worsening Chronicity:  New Context: not alcohol use   Relieved by:  Nothing Associated symptoms: no chest pain, no cough, no diarrhea, no fatigue and no hematuria     Past Medical History:  Diagnosis Date  . GERD (gastroesophageal reflux disease)     Patient Active Problem List   Diagnosis Date Noted  . Palpitations 03/21/2013  . Fatigue 03/21/2013  . Dyspnea 03/21/2013    History reviewed. No pertinent surgical history.      Home Medications    Prior to Admission medications   Medication Sig Start Date End Date Taking? Authorizing Provider  Probiotic Product (PROBIOTIC DAILY PO) Take 1 capsule by mouth daily.    [provider]    Family History Family History  Problem Relation Age of Onset  . Heart attack Maternal Grandfather 79  . Hypertension Maternal Grandfather   . Breast cancer Maternal Grandmother   . Arthritis Maternal Grandmother     Social History Social History   Tobacco Use  . Smoking status: Never Smoker  . Smokeless tobacco: Never Used  Substance Use Topics  . Alcohol use: No  . Drug use: No     Allergies   Penicillins and Codeine   Review of Systems Review of Systems  Constitutional: Negative for appetite change and fatigue.  HENT: Negative for congestion, ear discharge and sinus pressure.   Eyes: Negative for discharge.  Respiratory:  Negative for cough.   Cardiovascular: Negative for chest pain.  Gastrointestinal: Positive for abdominal pain. Negative for diarrhea.  Genitourinary: Negative for frequency and hematuria.  Musculoskeletal: Negative for back pain.  Skin: Negative for rash.  Neurological: Negative for seizures and headaches.  Psychiatric/Behavioral: Negative for hallucinations.     Physical Exam Updated Vital Signs BP 125/72   Pulse 65   Temp (!) 97.4 F (36.3 C) (Oral)   Resp 18   Ht 5\' 10"  (1.778 m)   Wt 74.8 kg   SpO2 100%   BMI 23.68 kg/m   Physical Exam Vitals signs and nursing note reviewed.  Constitutional:      Appearance: He is well-developed.  HENT:     Head: Normocephalic.     Nose: Nose normal.  Eyes:     General: No scleral icterus.    Conjunctiva/sclera: Conjunctivae normal.  Neck:     Musculoskeletal: Neck supple.     Thyroid: No thyromegaly.  Cardiovascular:     Rate and Rhythm: Normal rate and regular rhythm.     Heart sounds: No murmur. No friction rub. No gallop.   Pulmonary:     Breath sounds: No stridor. No wheezing or rales.  Chest:     Chest wall: No tenderness.  Abdominal:     General: There is no distension.     Tenderness: There is abdominal tenderness. There is no rebound.     Comments:  Tender left lower quadrant with rebound  Musculoskeletal: Normal range of motion.  Lymphadenopathy:     Cervical: No cervical adenopathy.  Skin:    Findings: No erythema or rash.  Neurological:     Mental Status: He is oriented to person, place, and time.     Motor: No abnormal muscle tone.     Coordination: Coordination normal.  Psychiatric:        Behavior: Behavior normal.      ED Treatments / Results  Labs (all labs ordered are listed, but only abnormal results are displayed) Labs Reviewed  CBC WITH DIFFERENTIAL/PLATELET - Abnormal; Notable for the following components:      Result Value   WBC 13.6 (*)    Neutro Abs 11.7 (*)    All other components  within normal limits  COMPREHENSIVE METABOLIC PANEL - Abnormal; Notable for the following components:   Glucose, Bld 142 (*)    Albumin 5.3 (*)    All other components within normal limits  SARS CORONAVIRUS 2 (HOSPITAL ORDER, PERFORMED IN [facility])  URINALYSIS, ROUTINE W REFLEX MICROSCOPIC    EKG None  Radiology Ct Abdomen Pelvis W Contrast  Result Date: 09/05/2018 CLINICAL DATA:  Abdominal distention EXAM: CT ABDOMEN AND PELVIS WITH CONTRAST TECHNIQUE: Multidetector CT imaging of the abdomen and pelvis was performed using the standard protocol following bolus administration of intravenous contrast. CONTRAST:  OMNIPAQUE IOHEXOL 300 MG/ML  SOLN COMPARISON:  Ultrasound 12/16/2015 FINDINGS: Lower chest: Lung bases are clear. No effusions. Heart is normal size. Hepatobiliary: No focal hepatic abnormality. Gallbladder unremarkable. Pancreas: No focal abnormality or ductal dilatation. Spleen: No focal abnormality.  Normal size. Adrenals/Urinary Tract: No adrenal abnormality. No focal renal abnormality. No stones or hydronephrosis. Urinary bladder is unremarkable. Stomach/Bowel: Dilated small bowel in the left abdomen. Stomach and proximal small bowel are decompressed. Distal small bowel also decompressed as is the colon. Findings compatible with small bowel obstruction. Given the appearance, closed loop obstruction related to internal hernia is possible. Vascular/Lymphatic: No evidence of aneurysm or adenopathy. Reproductive: No visible focal abnormality. Other: Trace free fluid in the cul-de-sac.  No free air. Musculoskeletal: No acute bony abnormality. IMPRESSION: Dilated fluid-filled small bowel loops in the left abdomen with decompressed small bowel proximal to and distal to these loops. Findings concerning for possible closed loop obstruction/internal hernia. Electronically Signed   By: Charlett Nose M.D.   On: 09/05/2018 03:22    Procedures Procedures (including critical care  time)  Medications Ordered in ED Medications  sodium chloride 0.9 % bolus 1,000 mL (0 mLs Intravenous Stopped 09/05/18 0351)  ondansetron (ZOFRAN) injection 4 mg (4 mg Intravenous Given 09/05/18 0212)  HYDROmorphone (DILAUDID) injection 0.5 mg (0.5 mg Intravenous Given 09/05/18 0213)  iohexol (OMNIPAQUE) 300 MG/ML solution 100 mL (100 mLs Intravenous Contrast Given 09/05/18 0256)  HYDROmorphone (DILAUDID) injection 0.5 mg (0.5 mg Intravenous Given 09/05/18 0348)  ondansetron (ZOFRAN) injection 4 mg (4 mg Intravenous Given 09/05/18 0348)     Initial Impression / Assessment and Plan / ED Course  I have reviewed the triage vital signs and the nursing notes.  Pertinent labs & imaging results that were available during my care of the patient were reviewed by me and considered in my medical decision making (see chart for details).        CT scan shows dilated small bowel loops concerning for possible closed-loop obstruction/internal hernia.  General surgery has been consulted and will see see the patient this morning and take  him to surgery  Final Clinical Impressions(s) / ED Diagnoses   Final diagnoses:  Left upper quadrant pain    ED Discharge Orders    None      CRITICAL CARE Performed by: Bethann Berkshire Total critical care time: 40 minutes Critical care time was exclusive of separately billable procedures and treating other patients. Critical care was necessary to treat or prevent imminent or life-threatening deterioration. Critical care was time spent personally by me on the following activities: development of treatment plan with patient and/or surrogate as well as nursing, discussions with consultants, evaluation of patient's response to treatment, examination of patient, obtaining history from patient or surrogate, ordering and performing treatments and interventions, ordering and review of laboratory studies, ordering and review of radiographic studies, pulse oximetry and re-evaluation  of patient's condition.  Bethann Berkshire, MD 09/05/18 Caren Macadam    Bethann Berkshire, MD 09/05/18 [phone]

## 2018-09-05 NOTE — ED Notes (Signed)
Pt had projectile vomiting while inserting ng tube.

## 2018-09-05 NOTE — ED Triage Notes (Signed)
Pt states he started having abdominal pain yesterday afternoon with nausea and vomiting. Pt states the pain is in the lower abdominal region.

## 2018-09-05 NOTE — Progress Notes (Signed)
Doing well. No complaints. NG in place. Will get pharmacy to schedule toradol and stagger with Tylenol.  Algis Greenhouse, MD

## 2018-09-06 ENCOUNTER — Encounter (HOSPITAL_COMMUNITY): Payer: Self-pay | Admitting: General Surgery

## 2018-09-06 LAB — CBC WITH DIFFERENTIAL/PLATELET
Abs Immature Granulocytes: 0.02 10*3/uL (ref 0.00–0.07)
Basophils Absolute: 0 10*3/uL (ref 0.0–0.1)
Basophils Relative: 0 %
Eosinophils Absolute: 0 10*3/uL (ref 0.0–0.5)
Eosinophils Relative: 0 %
HCT: 39.6 % (ref 39.0–52.0)
Hemoglobin: 13.5 g/dL (ref 13.0–17.0)
Immature Granulocytes: 0 %
Lymphocytes Relative: 13 %
Lymphs Abs: 1.1 10*3/uL (ref 0.7–4.0)
MCH: 30.1 pg (ref 26.0–34.0)
MCHC: 34.1 g/dL (ref 30.0–36.0)
MCV: 88.4 fL (ref 80.0–100.0)
Monocytes Absolute: 0.9 10*3/uL (ref 0.1–1.0)
Monocytes Relative: 10 %
Neutro Abs: 6.3 10*3/uL (ref 1.7–7.7)
Neutrophils Relative %: 77 %
Platelets: 239 10*3/uL (ref 150–400)
RBC: 4.48 MIL/uL (ref 4.22–5.81)
RDW: 13.5 % (ref 11.5–15.5)
WBC: 8.3 10*3/uL (ref 4.0–10.5)
nRBC: 0 % (ref 0.0–0.2)

## 2018-09-06 LAB — URINALYSIS, ROUTINE W REFLEX MICROSCOPIC
Bacteria, UA: NONE SEEN
Bilirubin Urine: NEGATIVE
Glucose, UA: NEGATIVE mg/dL
Hgb urine dipstick: NEGATIVE
Ketones, ur: 80 mg/dL — AB
Leukocytes,Ua: NEGATIVE
Nitrite: NEGATIVE
Protein, ur: 100 mg/dL — AB
Specific Gravity, Urine: 1.033 — ABNORMAL HIGH (ref 1.005–1.030)
pH: 5 (ref 5.0–8.0)

## 2018-09-06 LAB — BASIC METABOLIC PANEL
Anion gap: 11 (ref 5–15)
BUN: 15 mg/dL (ref 6–20)
CO2: 24 mmol/L (ref 22–32)
Calcium: 8.5 mg/dL — ABNORMAL LOW (ref 8.9–10.3)
Chloride: 106 mmol/L (ref 98–111)
Creatinine, Ser: 0.81 mg/dL (ref 0.61–1.24)
GFR calc Af Amer: >60 mL/min (ref >60)
GFR calc non Af Amer: >60 mL/min (ref >60)
Glucose, Bld: 107 mg/dL — ABNORMAL HIGH (ref 70–99)
Potassium: 3.9 mmol/L (ref 3.5–5.1)
Sodium: 141 mmol/L (ref 135–145)

## 2018-09-06 LAB — PHOSPHORUS: Phosphorus: 2.7 mg/dL (ref 2.5–4.6)

## 2018-09-06 LAB — HIV ANTIBODY (ROUTINE TESTING W REFLEX): HIV Screen 4th Generation wRfx: NONREACTIVE

## 2018-09-06 LAB — MAGNESIUM: Magnesium: 2.2 mg/dL (ref 1.7–2.4)

## 2018-09-06 MED ORDER — PHENOL 1.4 % MT LIQD
1.0000 | OROMUCOSAL | Status: DC | PRN
  Administered 2018-09-06: 1 via OROMUCOSAL
  Filled 2018-09-06: qty 354

## 2018-09-06 NOTE — Progress Notes (Signed)
[facility] Progress Note  1 Day Post-Op  Subjective: Doing fair. No flatus or BM. Minimal output from NG. NG flushed and sump flushed without return. Pain doing fine, not requiring narcotics.   Objective: Vital signs in last 24 hours: Temp:  [98.3 F (36.8 C)-99.8 F (37.7 C)] 98.3 F (36.8 C) (05/06 0506) Pulse Rate:  [56-95] 56 (05/06 0506) Resp:  [15-20] 20 (05/05 2214) BP: (129-143)/(67-90) 130/68 (05/06 0506) SpO2:  [95 %-99 %] 95 % (05/06 0506) Last BM Date: 09/05/18  Intake/Output from previous day: 05/05 0701 - 05/06 0700 In: 1100 [I.V.:1100] Out: 535 [Urine:500; Blood:35] Intake/Output this shift: No intake/output data recorded.  General appearance: alert, cooperative and no distress Resp: normal work of breathing GI: soft, distended, appropriately tender, midline with honeycomb dressing, staples in tact without erythema or drainage Extremities: extremities normal, atraumatic, no cyanosis or edema  Lab Results:  Recent Labs    09/05/18 0142 09/06/18 0459  WBC 13.6* 8.3  HGB 15.6 13.5  HCT 44.5 39.6  PLT 287 239   BMET Recent Labs    09/05/18 0142 09/06/18 0459  NA 141 141  K 4.0 3.9  CL 102 106  CO2 24 24  GLUCOSE 142* 107*  BUN 17 15  CREATININE 1.04 0.81  CALCIUM 9.7 8.5*    Studies/Results: Ct Abdomen Pelvis W Contrast  Result Date: 09/05/2018 CLINICAL DATA:  Abdominal distention EXAM: CT ABDOMEN AND PELVIS WITH CONTRAST TECHNIQUE: Multidetector CT imaging of the abdomen and pelvis was performed using the standard protocol following bolus administration of intravenous contrast. CONTRAST:  OMNIPAQUE IOHEXOL 300 MG/ML  SOLN COMPARISON:  Ultrasound 12/16/2015 FINDINGS: Lower chest: Lung bases are clear. No effusions. Heart is normal size. Hepatobiliary: No focal hepatic abnormality. Gallbladder unremarkable. Pancreas: No focal abnormality or ductal dilatation. Spleen: No focal abnormality.  Normal size. Adrenals/Urinary  Tract: No adrenal abnormality. No focal renal abnormality. No stones or hydronephrosis. Urinary bladder is unremarkable. Stomach/Bowel: Dilated small bowel in the left abdomen. Stomach and proximal small bowel are decompressed. Distal small bowel also decompressed as is the colon. Findings compatible with small bowel obstruction. Given the appearance, closed loop obstruction related to internal hernia is possible. Vascular/Lymphatic: No evidence of aneurysm or adenopathy. Reproductive: No visible focal abnormality. Other: Trace free fluid in the cul-de-sac.  No free air. Musculoskeletal: No acute bony abnormality. IMPRESSION: Dilated fluid-filled small bowel loops in the left abdomen with decompressed small bowel proximal to and distal to these loops. Findings concerning for possible closed loop obstruction/internal hernia. Electronically Signed   By: Charlett Nose M.D.   On: 09/05/2018 03:22   Dg Chest Port 1 View  Result Date: 09/05/2018 CLINICAL DATA:  Preop testing. EXAM: PORTABLE CHEST 1 VIEW COMPARISON:  01/27/2015 FINDINGS: Normal heart size and mediastinal contours. No acute infiltrate or edema. No effusion or pneumothorax. No acute osseous findings. IMPRESSION: Negative chest. Electronically Signed   By: Marnee Spring M.D.   On: 09/05/2018 04:44   Dg Chest Port 1v Same Day  Result Date: 09/05/2018 CLINICAL DATA:  Nasogastric tube placement EXAM: PORTABLE CHEST 1 VIEW COMPARISON:  Earlier today FINDINGS: New nasogastric tube with tip at the fundus of the stomach. Lungs remain clear. Normal heart size. IMPRESSION: New nasogastric tube with tip at the gastric fundus. Electronically Signed   By: Marnee Spring M.D.   On: 09/05/2018 05:28    Assessment/Plan: Ryan Dougherty is a 28 yo POD 1 s/p diagnostic laparoscopy converted to open with reduction of internal hernia  and closure of mesenteric defect. No major issues. Pain controlled.  Scheduled toradol, acetaminophen  PRN dilaudid IS, OOB HD  wnl NPO with ice and NG in place, minimal output  H&H stable, no WBC  Lytes looking good, LR reduced to 75cc /hr, voiding BMP tomorrow SCDs, lovenox  Updated mom, Ryan Dougherty, [phone]    LOS: 1 day    Ryan Dougherty 09/06/2018

## 2018-09-06 NOTE — Progress Notes (Signed)
Patient stated that he had passed gas and that his stomach "feels better since" Patient has been up ambulating in room without complaints.

## 2018-09-07 LAB — BASIC METABOLIC PANEL
Anion gap: 9 (ref 5–15)
BUN: 17 mg/dL (ref 6–20)
CO2: 23 mmol/L (ref 22–32)
Calcium: 8.3 mg/dL — ABNORMAL LOW (ref 8.9–10.3)
Chloride: 106 mmol/L (ref 98–111)
Creatinine, Ser: 0.78 mg/dL (ref 0.61–1.24)
GFR calc Af Amer: >60 mL/min (ref >60)
GFR calc non Af Amer: >60 mL/min (ref >60)
Glucose, Bld: 80 mg/dL (ref 70–99)
Potassium: 3.8 mmol/L (ref 3.5–5.1)
Sodium: 138 mmol/L (ref 135–145)

## 2018-09-07 LAB — MAGNESIUM: Magnesium: 1.8 mg/dL (ref 1.7–2.4)

## 2018-09-07 LAB — PHOSPHORUS: Phosphorus: 2.5 mg/dL (ref 2.5–4.6)

## 2018-09-07 MED ORDER — POTASSIUM & SODIUM PHOSPHATES 280-160-250 MG PO PACK
1.0000 | PACK | Freq: Three times a day (TID) | ORAL | Status: AC
  Administered 2018-09-07 (×3): 1 via ORAL
  Filled 2018-09-07 (×3): qty 1

## 2018-09-07 MED ORDER — MAGNESIUM SULFATE 2 GM/50ML IV SOLN
2.0000 g | Freq: Once | INTRAVENOUS | Status: AC
  Administered 2018-09-07: 2 g via INTRAVENOUS
  Filled 2018-09-07: qty 50

## 2018-09-07 NOTE — Progress Notes (Addendum)
Reported loose, green bm this afternoon.  Rates pain a 4 and denies any increase in pain since starting clears.  Denies nausea. Advanced diet to full liquid.  Eating chocolate ice cream now. Ambulated in hallway this afternoon.

## 2018-09-07 NOTE — Progress Notes (Signed)
Denies nausea since NG tube removed at 0920 and diet advanced to clears.  Ambulated some in room.  Encouraged to ambulate in hallway. Dressing dry and intact

## 2018-09-08 LAB — BASIC METABOLIC PANEL
Anion gap: 7 (ref 5–15)
BUN: 11 mg/dL (ref 6–20)
CO2: 25 mmol/L (ref 22–32)
Calcium: 8.1 mg/dL — ABNORMAL LOW (ref 8.9–10.3)
Chloride: 106 mmol/L (ref 98–111)
Creatinine, Ser: 0.7 mg/dL (ref 0.61–1.24)
GFR calc Af Amer: >60 mL/min (ref >60)
GFR calc non Af Amer: >60 mL/min (ref >60)
Glucose, Bld: 101 mg/dL — ABNORMAL HIGH (ref 70–99)
Potassium: 3.8 mmol/L (ref 3.5–5.1)
Sodium: 138 mmol/L (ref 135–145)

## 2018-09-08 LAB — PHOSPHORUS: Phosphorus: 3.1 mg/dL (ref 2.5–4.6)

## 2018-09-08 LAB — MAGNESIUM: Magnesium: 1.8 mg/dL (ref 1.7–2.4)

## 2018-09-08 MED ORDER — DOCUSATE SODIUM 100 MG PO CAPS: 100.0000 mg | ORAL_CAPSULE | Freq: Two times a day (BID) | ORAL | 2 refills | Status: DC | PRN

## 2018-09-08 MED ORDER — TRAMADOL HCL 50 MG PO TABS
50.0000 mg | ORAL_TABLET | Freq: Four times a day (QID) | ORAL | Status: DC | PRN
  Filled 2018-09-08: qty 1

## 2018-09-08 MED ORDER — ONDANSETRON 4 MG PO TBDP: 4.0000 mg | ORAL_TABLET | Freq: Four times a day (QID) | ORAL | 0 refills | Status: DC | PRN

## 2018-09-08 MED ORDER — TRAMADOL HCL 50 MG PO TABS: 50.0000 mg | ORAL_TABLET | Freq: Four times a day (QID) | ORAL | 0 refills | Status: DC | PRN

## 2018-09-08 MED ORDER — DIPHENHYDRAMINE HCL 50 MG/ML IJ SOLN: 25.0000 mg | Freq: Four times a day (QID) | INTRAMUSCULAR | Status: DC | PRN

## 2018-09-08 MED ORDER — IBUPROFEN 800 MG PO TABS
800.0000 mg | ORAL_TABLET | Freq: Four times a day (QID) | ORAL | Status: DC
  Administered 2018-09-08 (×2): 800 mg via ORAL
  Filled 2018-09-08 (×2): qty 1

## 2018-09-08 MED ORDER — OXYCODONE HCL 5 MG PO TABS
5.0000 mg | ORAL_TABLET | ORAL | Status: DC | PRN
  Administered 2018-09-08: 5 mg via ORAL
  Filled 2018-09-08: qty 1

## 2018-09-08 MED ORDER — POTASSIUM CHLORIDE 20 MEQ PO PACK
20.0000 meq | PACK | Freq: Once | ORAL | Status: AC
  Administered 2018-09-08: 20 meq via ORAL
  Filled 2018-09-08: qty 1

## 2018-09-08 MED ORDER — MUPIROCIN 2 % EX OINT
1.0000 "application " | TOPICAL_OINTMENT | Freq: Two times a day (BID) | CUTANEOUS | Status: DC
  Administered 2018-09-08: 1 via NASAL
  Filled 2018-09-08: qty 22

## 2018-09-08 MED ORDER — CHLORHEXIDINE GLUCONATE CLOTH 2 % EX PADS
6.0000 | MEDICATED_PAD | Freq: Every day | CUTANEOUS | Status: DC
  Administered 2018-09-08: 15:00:00 6 via TOPICAL

## 2018-09-08 MED ORDER — MAGNESIUM SULFATE 2 GM/50ML IV SOLN
2.0000 g | Freq: Once | INTRAVENOUS | Status: AC
  Administered 2018-09-08: 2 g via INTRAVENOUS
  Filled 2018-09-08: qty 50

## 2018-09-08 NOTE — Discharge Instructions (Signed)
Discharge Open Abdominal Surgery Instructions:  Common Complaints: Pain at the incision site is common. This will improve with time.  Take your pain medications as described below. Some nausea is common and poor appetite. The main goal is to stay hydrated the first few days after surgery.   Diet/ Activity: Diet as tolerated. You have started and tolerated a diet in the hospital, and should continue to increase what you are able to eat.   You may not have a large appetite, but it is important to stay hydrated. Drink 64 ounces of water a day. Your appetite will return with time.  Keep a dry dressing in place over your staples daily or as needed. Some minor pink/ blood tinged drainage is expected. This will stop in a few days after surgery.  Shower per your regular routine daily.  Do not take hot showers. Take warm showers that are less than 10 minutes. Path the incision dry. Wear an abdominal binder daily with activity. You do not have to wear this while sleeping or sitting.  Rest and listen to your body, but do not remain in bed all day.  Walk everyday for at least 15-20 minutes. Deep cough and move around every 1-2 hours in the first few days after surgery.  Do not lift > 10 lbs, perform excessive bending, pushing, pulling, squatting for 6-8 weeks after surgery.  The activity restrictions and the abdominal binder are to prevent hernia formation at your incision while you are healing.  Do not place lotions or balms on your incision unless instructed to specifically by Dr. Henreitta Leber.  You can drive once you are not taking narcotic pain medication, you can slam on the breaks without pain and can turn the stirring wheel.   Medication: Take tylenol and ibuprofen as needed for pain control, alternating every 4-6 hours.  Example:  Tylenol 1000mg  @ 6am, 12noon, 6pm, (Do not exceed 4000mg  of tylenol a day). Ibuprofen 800mg  @ 9am, 3pm, 9pm, 3am (Do not exceed 3600mg  of ibuprofen a day).  Take  Tramadol for breakthrough pain every 6 hours.  Take Colace for constipation related to narcotic pain medication. If you do not have a bowel movement in 2 days, take Miralax over the counter.  Drink plenty of water to also prevent constipation.   Contact Information: If you have questions or concerns, please call our office, [phone], Monday- Thursday 8AM-5PM and Friday 8AM-12Noon.  If it is after hours or on the weekend, please call [facility], [phone], and ask to speak to the surgeon on call for Dr. Henreitta Leber at [facility].    Exploratory Laparotomy, Adult, Care After This sheet gives you information about how to care for yourself after your procedure. Your health care provider may also give you more specific instructions. If you have problems or questions, contact your health care provider. What can I expect after the procedure? After the procedure, it is common to have:  Abdominal soreness.  Fatigue.  A sore throat from the tube in your throat.  A lack of appetite. Follow these instructions at home: Medicines  Take over-the-counter and prescription medicines only as told by your health care provider.  If you were prescribed an antibiotic medicine, take it as told by your health care provider. Do not stop taking the antibiotic even if you start to feel better.  Do not drive or operate heavy machinery while taking pain medicine.  If you are taking prescription pain medicine, take actions to prevent or treat  constipation. Your health care provider may recommend that you: ? Drink enough fluid to keep your urine pale yellow. ? Eat foods that are high in fiber, such as fresh fruits and vegetables, whole grains, and beans. ? Limit foods that are high in fat and processed sugars, such as fried or sweet foods. ? Take an over-the-counter or prescription medicine for constipation. Undergoing surgery and taking pain medicines can make constipation worse. Incision  care   Follow instructions from your health care provider about how to take care of your incision. Make sure you: ? Wash your hands with soap and water before you change your bandage (dressing). If soap and water are not available, use hand sanitizer. ? Change your dressing as told by your health care provider.  Leave staples in place.   Check your incision area every day for signs of infection. Check for: ? Redness, swelling, or pain. ? Fluid or blood. ? Warmth. ? Pus or a bad smell. Activity   Rest as told by your health care provider. ? Avoid sitting for a long time without moving. Get up to take short walks every 1-2 hours. This is important to improve blood flow and breathing. Ask for help if you feel weak or unsteady.  Do not lift anything that is heavier than 10 lb (2.2 kg), or the limit that your health care provider tells you, until he or she says that it is safe. Eating and drinking  You may eat what you usually eat. Include lots of whole grains, fruits, and vegetables in your diet. This will help to prevent constipation.  Drink enough fluid to keep your urine pale yellow. Bathing  Keep your incision clean and dry. Clean it as often as told by your health care provider: ? Gently wash the incision with soap and water. ? Rinse the incision with water to remove all soap. ? Pat the incision dry with a clean towel. Do not rub the incision.  You may take showers after 48 hours.  Do not take baths, swim, or use a hot tub until your health care provider says it is okay to do so. General instructions  Do not use any products that contain nicotine or tobacco, such as cigarettes and e-cigarettes. These can delay healing after surgery. If you need help quitting, ask your health care provider.  Wear compression stockings as told by your health care provider. These stockings help to prevent blood clots and reduce swelling in your legs.  Keep all follow-up visits as told by your  health care provider. This is important. Contact a health care provider if:  You have a fever.  You have chills.  Your pain medicine is not helping.  You have constipation or diarrhea.  You have nausea or vomiting.  You have drainage, redness, swelling, or pain at your incision site. Get help right away if:  Your pain is getting worse.  You have not had a bowel movement for more than 3 days.  You have ongoing (persistent) vomiting.  The edges of your incision open up.  You have warmth, tenderness, and swelling in your calf.  You have trouble breathing.  You have chest pain. These symptoms may represent a serious problem that is an emergency. Do not wait to see if the symptoms will go away. Get medical help right away. Call your local emergency services (911 in the [place]). Do not drive yourself to the hospital. Summary  Abdominal soreness is common after exploratory laparotomy. Take over-the-counter  and prescription pain medicines only as told by your health care provider.  Follow instructions from your health care provider about how to take care of your incision. Do not take baths, swim, or use a hot tub until your health care provider says it is okay to do so.  Watch for signs and symptoms of infection after surgery, including fever, chills, drainage from your incision, and worsening abdominal pain. This information is not intended to replace advice given to you by your health care provider. Make sure you discuss any questions you have with your health care provider. Document Released: 12/02/2003 Document Revised: 04/29/2017 Document Reviewed: 04/29/2017 [employer]vier Interactive Patient Education  2019 [employer]

## 2018-09-08 NOTE — Discharge Summary (Signed)
Physician Discharge Summary  Patient ID: Ryan Dougherty MRN: [id number] DOB/AGE: [birth year] 28 y.o.  Admit date: 09/05/2018 Discharge date: 09/08/2018  Admission Diagnoses: SBO possible internal hernia   Discharge Diagnoses:  Active Problems:   Internal hernia   Other complete intestinal obstruction [facility](HCC)   Discharged Condition: good  Hospital Course: Mr. Ryan AlarHaithcock is a 28 yo with a history of abdominal pain and nausea/vomiting that was acute in onset and resulted in him coming to the ED. His CT was concerning for a SBO related to a closed loop obstruction from an internal hernia.  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 This was reduced and the bowel was placed back into anatomic alignment. The patient tolerated the procedure but had a post operative ileus. He required an NG for a few days, but ultimately had this removed on POD 2.  He was started on liquids and advanced as he had BMs. He had over 15+ BMs prior to his discharge.  He was having great pain control with tylenol and ibuprofen and tried Roxicodone but had some reactions with some chest tightness and SOB and nausea.  This was changed to tramadol for d/c home.  Prior to his discharge he was tolerating a diet, had good pain control, was ambulating and was tolerating a soft diet.   Consults: None  Significant Diagnostic Studies: CT a/p- internal hernia causing SBO   Treatments: Diagnostic laparoscopy to laparotomy with reduction of the internal hernia and closure of the mesenteric defect   Discharge Exam: Blood pressure 134/75, pulse 61, temperature 98.6 F (37 C), temperature source Oral, resp. rate 16, height 5\' 10"  (1.778 m), weight 73.9 kg, SpO2 100 %. General appearance: alert, cooperative and no distress Resp: normal work of breathing GI: soft, mildly distended, appropriately tender, bruising around the incision, staples intact, no erythema or drainage, port sites  intact Extremities: extremities normal, atraumatic, no cyanosis or edema  Disposition: Discharge disposition: 01-Home or Self Care       Discharge Instructions    Call MD for:  difficulty breathing, headache or visual disturbances   Complete by:  As directed    Call MD for:  extreme fatigue   Complete by:  As directed    Call MD for:  persistant dizziness or light-headedness   Complete by:  As directed    Call MD for:  persistant nausea and vomiting   Complete by:  As directed    Call MD for:  redness, tenderness, or signs of infection (pain, swelling, redness, odor or green/yellow discharge around incision site)   Complete by:  As directed    Call MD for:  severe uncontrolled pain   Complete by:  As directed    Call MD for:  temperature >100.4   Complete by:  As directed    Increase activity slowly   Complete by:  As directed      Allergies as of 09/08/2018      Reactions   Penicillins Rash   Did it involve swelling of the face/tongue/throat, SOB, or low BP? No Did it involve sudden or severe rash/hives, skin peeling, or any reaction on the inside of your mouth or nose? Yes Did you need to seek medical attention at a hospital or doctor's office? No When did it last happen? If all above answers are "NO", may proceed with cephalosporin use.   Codeine Rash      Medication List    TAKE  these medications   docusate sodium 100 MG capsule Commonly known as:  Colace Take 1 capsule (100 mg total) by mouth 2 (two) times daily as needed for mild constipation (related to any narcotics).   ondansetron 4 MG disintegrating tablet Commonly known as:  ZOFRAN-ODT Take 1 tablet (4 mg total) by mouth every 6 (six) hours as needed for nausea.   traMADol 50 MG tablet Commonly known as:  ULTRAM Take 1 tablet (50 mg total) by mouth every 6 (six) hours as needed for moderate pain.      Follow-up Information    Lucretia Roers, MD Follow up on 09/14/2018.   Specialty:  General  Surgery Why:  staple removal  Contact information: [address] [facility] [place] [address] [phone]           Signed: Lucretia Roers 09/08/2018, 5:14 PM

## 2018-09-08 NOTE — Progress Notes (Signed)
After roxycodone dose this morning reported light-headedness, dizziness, sob, chest tightness and nausea.  Sent message to Dr. Henreitta Leber and she changed medication to tramadol.

## 2018-09-14 ENCOUNTER — Encounter: Payer: Self-pay | Admitting: General Surgery

## 2018-09-14 ENCOUNTER — Ambulatory Visit (INDEPENDENT_AMBULATORY_CARE_PROVIDER_SITE_OTHER): Payer: Self-pay | Admitting: General Surgery

## 2018-09-14 ENCOUNTER — Other Ambulatory Visit: Payer: Self-pay

## 2018-09-14 VITALS — BP 115/81 | HR 59 | Temp 98.7°F | Resp 16 | Ht 70.0 in | Wt 162.0 lb

## 2018-09-14 DIAGNOSIS — K458 Other specified abdominal hernia without obstruction or gangrene: Secondary | ICD-10-CM

## 2018-09-14 DIAGNOSIS — K56691 Other complete intestinal obstruction: Secondary | ICD-10-CM

## 2018-09-14 NOTE — Progress Notes (Signed)
[facility] Note   HPI:  28 y.o. Male presents to clinic for post-op follow-up evaluation after an Ex lap and reduction of internal hernia and closure of mesenteric defect. He is doing well. He is ambulating, tolerating a diet and having Bms every other day or so. He is needing the stool softener for now.   Review of Systems:  No fever or chills Improving soreness No drainage from wound or redness  All other review of systems: otherwise negative   Vital Signs:  BP 115/81 (BP Location: Left Arm, Patient Position: Sitting, Cuff Size: Normal)   Pulse (!) 59   Temp 98.7 F (37.1 C) (Temporal)   Resp 16   Ht 5\' 10"  (1.778 m)   Wt 162 lb (73.5 kg)   SpO2 98%   BMI 23.24 kg/m    Physical Exam:  Physical Exam HENT:     Head: Normocephalic.  Pulmonary:     Effort: Pulmonary effort is normal.  Abdominal:     General: There is no distension.     Palpations: Abdomen is soft.     Tenderness: There is no abdominal tenderness.     Comments: Midline healing, staple removed, no erythema or drainage, steri strips applied, bruising improving  Neurological:     Mental Status: He is alert.      Assessment:  28 y.o. 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 No issues and doing well overall.   Plan:  Diet as tolerated.  Wear an abdominal binder daily with activity. You do not have to wear this while sleeping or sitting.  Walk everyday for at least 15-20 minutes.  Do not lift > 10 lbs, perform excessive bending, pushing, pulling, squatting for 6-8 weeks after surgery.  The activity restrictions and the abdominal binder are to prevent hernia formation at your incision while you are healing.  Do not place lotions or balms on your incision unless instructed to specifically by Dr. Henreitta Leber.  Keep your stools soft and regular, take stool softener as needed and miralax if you haven't had a Bm in a few days.   Drink  64 ounces of water a day. This will help with constipation.   All of the above recommendations were discussed with the patient and all of patient's questions were answered to his expressed satisfaction.  Follow up PRN.   Algis Greenhouse, MD [facility] [address] [place], [place] [address] [phone] (office)

## 2018-09-14 NOTE — Patient Instructions (Addendum)
Diet as tolerated.  Wear an abdominal binder daily with activity. You do not have to wear this while sleeping or sitting.  Walk everyday for at least 15-20 minutes.  Do not lift > 10 lbs, perform excessive bending, pushing, pulling, squatting for 6-8 weeks after surgery.  The activity restrictions and the abdominal binder are to prevent hernia formation at your incision while you are healing.  Do not place lotions or balms on your incision unless instructed to specifically by Dr. Henreitta Leber.  Keep your stools soft and regular, take stool softener as needed and miralax if you haven't had a Bm in a few days.  Drink  64 ounces of water a day. This will help with constipation.

## 2018-10-09 ENCOUNTER — Telehealth: Payer: Self-pay | Admitting: General Surgery

## 2018-10-09 ENCOUNTER — Telehealth: Payer: Self-pay

## 2018-10-09 NOTE — Telephone Encounter (Signed)
Mother, Clarise Cruz, called concerned that her son (the patient) has a blood clot formed at the end of his incision. Would like to speak with Dr Constance Haw.

## 2018-10-09 NOTE — Telephone Encounter (Signed)
[facility]  Czar's mom, Clarise Cruz called and was worried about his incision at the lower level. It had been bleeding some and has clotted.  Says it looks like a blood blister. She is worried about it.   Will plan to see him tomorrow @ 10:45Am.  Curlene Labrum, MD [facility] [address] [place], [place] [address] [phone] (office)

## 2018-10-10 ENCOUNTER — Other Ambulatory Visit: Payer: Self-pay

## 2018-10-10 ENCOUNTER — Encounter: Payer: Self-pay | Admitting: General Surgery

## 2018-10-10 ENCOUNTER — Ambulatory Visit (INDEPENDENT_AMBULATORY_CARE_PROVIDER_SITE_OTHER): Payer: Self-pay | Admitting: General Surgery

## 2018-10-10 VITALS — BP 127/80 | HR 56 | Temp 98.2°F | Resp 16 | Ht 67.0 in | Wt 163.0 lb

## 2018-10-10 DIAGNOSIS — K56691 Other complete intestinal obstruction: Secondary | ICD-10-CM

## 2018-10-10 DIAGNOSIS — K458 Other specified abdominal hernia without obstruction or gangrene: Secondary | ICD-10-CM

## 2018-10-10 NOTE — Progress Notes (Signed)
[facility] Note   HPI:  28 y.o. Male presents to clinic for a wound check. His mom called and was worried about the inferior portion of his wound/ concerned clot in the area.   Review of Systems:  No fever or chills Eating and having BMs All other review of systems: otherwise negative   Vital Signs:  BP 127/80 (BP Location: Left Arm, Patient Position: Sitting, Cuff Size: Normal)   Pulse (!) 56   Temp 98.2 F (36.8 C) (Temporal)   Resp 16   Ht 5\' 7"  (1.702 m)   Wt 163 lb (73.9 kg)   SpO2 98%   BMI 25.53 kg/m    Physical Exam:  Physical Exam Cardiovascular:     Rate and Rhythm: Normal rate.  Pulmonary:     Effort: Pulmonary effort is normal.  Abdominal:     Comments: Incision healing, inferior portion about 2 cm area with decompressed skin from possible seroma/ hemotoma prior, skin purple in that area? Keloiding, no redness or drainage  Neurological:     Mental Status: He is alert.     Assessment:  28 y.o. yo Male with inferior wound area that is either keloiding or is a decompressed area of seroma/ hematoma. No signs of infection.  Plan:  Possible keloid? Versus fluid pocket on incision.  No signs of infection. Hydrocortisone or neosporin to the area as desired daily. Monitor for extending redness.  Continue lifting and activity precautions until 6-8 weeks post op.  All of the above recommendations were discussed with the patient, and all of patient's questions were answered to his expressed satisfaction.  Curlene Labrum, MD [facility] [address] [place], [place] [address] [phone] (office)

## 2018-10-10 NOTE — Patient Instructions (Addendum)
Possible keloid? Versus fluid pocket on incision.  No signs of infection. Hydrocortisone or neosporin to the area as desired daily. Monitor for extending redness.  Continue lifting and activity precautions until 6-8 weeks post op.

## 2018-12-06 ENCOUNTER — Telehealth: Payer: Self-pay | Admitting: *Deleted

## 2018-12-06 ENCOUNTER — Encounter: Payer: Self-pay | Admitting: Family Medicine

## 2018-12-06 NOTE — Telephone Encounter (Signed)
I called and left message on machine for patient I last saw him in October 2018, I am not up-to-date on this most recent ear issue I did not speak with his mother, as I do not have his express permission to do so and he is 28 years old.  If he would please let me know what he needs, I am glad to try and help We will send a MyChart message

## 2018-12-06 NOTE — Telephone Encounter (Signed)
Copied from [place] [id number]. Topic: Appointment Scheduling - Scheduling Inquiry for Clinic >> Dec 06, 2018  4:10 PM Oneta Rack wrote: Osvaldo Human name: Ena Dawley Relation to pt: mother  Call back number:  [phone]   Reason for call:  Patient ringing ear pain has not improved, patient states it feels as if something is in his ear.  ENT cleaned out wax but patient states symptoms have not improved, patient requesting x ray and specialitast states that's not neccessary. Mother would like to speak with PCP, please advise

## 2018-12-11 ENCOUNTER — Other Ambulatory Visit: Payer: Self-pay

## 2018-12-12 NOTE — Progress Notes (Addendum)
[facility] [address] [place], [place] [address] [phone] [phone]  Date:  12/13/2018   Name:  Ryan PiggJason T Dougherty   DOB:  07-16-1990   MRN:  [id number]  PCP:  Pearline Cablesopland, Chyla Schlender C, MD    Chief Complaint: Otalgia (1 year, fullness, left ear, mild trouble hearing)   History of Present Illness:  Ryan PiggJason T Dougherty is a 28 y.o. very pleasant male patient who presents with the following:  Pt who I last saw in October of 2018- here today with concern of some ear problems We got a phone call from his mom, at which time I contacted the patient to get more details. He sent me the following message: The issues are with my left ear, beginning late last year with feelings of fullness and an earache only in the left ear that would come and go. I would only notice it intermittently and tried not to think about it, until by June it became nearly constant, together with tinnitus, jaw/temple/throat ache on the left side, and a random "popping" in the left ear.   Last month, I had my ears cleaned, and hoped that clearing impacted wax would solve things, but there was no change, nor have allergy medications ever had any affect. I've been told the ear canal looks fine, and that there are no other tests or scans to be done, and told many possibilities like ETD, allergy issues and/or TMJ. But it's 24/7 now, and I'm unsure what to do next.  He notes that late last year he developed some left ear discomfort- fullness, aching.  Then earlier this summer it got a lot worse- he went to see Dr. Annalee GentaShoemaker and had his ears cleaned out.   He went back a week or so ago- his ear looked ok on follow-up Seen by ENT on 7/14 and also 7/28- per most recent plan   The patient presents for follow-up evaluation with a history of left medial cerumen impaction which was cleared 2 weeks ago. He continues to have fullness in the left ear with mild otalgia, popping sensation and  left temporal headaches. Ear exam today showed no evidence of erythema, infection, trauma or further ear wax, essentially a normal ear exam. The patient's symptoms may be related to other issues including possible TMJ dysfunction and possible eustachian tube dysfunction. We discussed these problems and their appropriate treatment. Recommend using fluticasone nightly, saline nasal spray and monitoring for symptoms of allergy and nasal congestion. We also discussed TMJ precautions, the patient may consider follow-up with his dentist for further evaluation of symptoms do not resolve spontaneously. Monitor for additional symptoms and follow-up with me as needed. May consider audiogram in the future if the patient continues to have otologic symptoms.  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 I have recommended TMJ precautions which included: avoiding jaw trauma, avoiding jaw clenching or tooth grinding, avoiding ice, gum or hard candy. Use a soft diet, use a warm compress or heating pad at night, use OTC anti-inflammatories such as ibuprofen (600 mg p.o. t.i.d. for 10 days). If not improved with the above treatments the patient should seek dental evaluation by their primary dentist or an oral surgeon. [facility]   Hearing seems ok He also does have tinnitus but this is not new He may notice a sensation of his heart beating in his ear sometimes  His discomfort does NOT come in sharp waves but is more constant.  No inciting factors that he can determine   He has used ibuprofen - helps temporarily but it does not resolve his issue   He did have a hernia repair earlier this year in May  He does his routine dental cleanings - he is about due now  No tooth pain noted  It does not hurt to chew   No cough or fever  Patient Active Problem List   Diagnosis Date Noted  . Internal hernia 09/05/2018  .  Other complete intestinal obstruction (HCC)   . Palpitations 03/21/2013  . Fatigue 03/21/2013  . Dyspnea 03/21/2013    Past Medical History:  Diagnosis Date  . GERD (gastroesophageal reflux disease)     Past Surgical History:  Procedure Laterality Date  . LAPAROSCOPY N/A 09/05/2018   Procedure: LAPAROSCOPY DIAGNOSTIC CONVERTED TO LAPAROTOMY (AT 0848) WITH REDUCTION OF INTERNAL HERNIA  AND CLOSURE OF MESENTERIC DEFECT;  Surgeon: Lucretia RoersBridges, Lindsay C, MD;  Location: [facility];  Service: General;  Laterality: N/A;    Social History   Tobacco Use  . Smoking status: Never Smoker  . Smokeless tobacco: Never Used  Substance Use Topics  . Alcohol use: No  . Drug use: No    Family History  Problem Relation Age of Onset  . Heart attack Maternal Grandfather 79  . Hypertension Maternal Grandfather   . Breast cancer Maternal Grandmother   . Arthritis Maternal Grandmother     Allergies  Allergen Reactions  . Penicillins Rash    Did it involve swelling of the face/tongue/throat, SOB, or low BP? No Did it involve sudden or severe rash/hives, skin peeling, or any reaction on the inside of your mouth or nose? Yes Did you need to seek medical attention at a hospital or doctor's office? No When did it last happen? If all above answers are "NO", may proceed with cephalosporin use.   . Codeine Rash  . Roxicodone [Oxycodone Hcl] Other (See Comments)    Shortness of breath and chest tightness with some nausea    Medication list has been reviewed and updated.  No current outpatient medications on file prior to visit.   No current facility-administered medications on file prior to visit.     Review of Systems:  As per HPI- otherwise negative. He has tried nasal steroid sprays Noted tachycardia- pt admits he is nervous.  No CP or SOB   Physical Examination: Vitals:   12/13/18 1504  BP: (!) 118/92  Pulse: (!) 112  Resp: 16  Temp: 98.1 F (36.7 C)  SpO2: 98%   Vitals:    12/13/18 1504  Weight: 158 lb (71.7 kg)  Height: 5\' 7"  (1.702 m)   Body mass index is 24.75 kg/m. Ideal Body Weight: Weight in (lb) to have BMI = 25: 159.3  GEN: WDWN, NAD, Non-toxic, A & O x 3, normal weight, looks well  HEENT: Atraumatic, Normocephalic. Neck supple. No masses, No LAD. Bilateral TM wnl, oropharynx normal.  PEERL,EOMI.   Nasal cavity normal The TMJ is not tender to palpation  No abnl of neck, ears, face is noted today  Ears and Nose: No external deformity. CV: RRR, No M/G/R. No JVD. No thrill. No extra heart sounds. PULM: CTA B, no wheezes, crackles, rhonchi. No retractions. No resp. distress. No accessory muscle use. ABD: S, NT, ND, +BS. No rebound. No HSM. EXTR: No c/c/e NEURO Normal gait.  PSYCH: Normally interactive. Conversant. Not depressed or  anxious appearing.  Calm demeanor.   BP Readings from Last 3 Encounters:  12/13/18 (!) 118/92  10/10/18 127/80  09/14/18 115/81   Pulse Readings from Last 3 Encounters:  12/13/18 (!) 112  10/10/18 (!) 56  09/14/18 (!) 59    Assessment and Plan:   ICD-10-CM   1. Left ear pain  H92.02 CBC    Sedimentation rate    C-reactive protein    predniSONE (DELTASONE) 20 MG tablet    Ambulatory referral to Neurology  2. Left facial pain  R51 CBC    Sedimentation rate    C-reactive protein    Ambulatory referral to Neurology  3. Screening for diabetes mellitus  Z13.1 Comprehensive metabolic panel    Hemoglobin A1c  4. Screening for hyperlipidemia  Z13.220 Lipid panel     Follow-up: No follow-ups on file.  Meds ordered this encounter  Medications  . predniSONE (DELTASONE) 20 MG tablet    Sig: Take 2 pills a day for 4 days then 1 pill a day for 4 days    Dispense:  12 tablet    Refill:  0   Orders Placed This Encounter  Procedures  . CBC  . Comprehensive metabolic panel  . Hemoglobin A1c  . Lipid panel  . Sedimentation rate  . C-reactive protein  . Ambulatory referral to Neurology    @SIGN @  Here  today with left ear/ facial pain for several month He has seen ENT twice Consider dental pain, TMJ, trigeminal neuralgia, ETD Went over this ddx in detail with pt For the time being will check labs and try a course of pred for possible ETD non responsive to nasal steroid spray Also placed neurology referral in case this is needed Asked him to see DDS to cleaning and exam, rule out caries  See patient instructions for more details.    Will plan further follow- up pending labs.   Signed Lamar Blinks, MD Received his albs 8/13- message to pt  Results for orders placed or performed in visit on 12/13/18  CBC  Result Value Ref Range   WBC 4.5 4.0 - 10.5 K/uL   RBC 5.14 4.22 - 5.81 Mil/uL   Platelets 270.0 150.0 - 400.0 K/uL   Hemoglobin 15.1 13.0 - 17.0 g/dL   HCT 43.7 39.0 - 52.0 %   MCV 85.0 78.0 - 100.0 fl   MCHC 34.6 30.0 - 36.0 g/dL   RDW 13.3 11.5 - 15.5 %  Comprehensive metabolic panel  Result Value Ref Range   Sodium 138 135 - 145 mEq/L   Potassium 4.5 3.5 - 5.1 mEq/L   Chloride 105 96 - 112 mEq/L   CO2 22 19 - 32 mEq/L   Glucose, Bld 110 (H) 70 - 99 mg/dL   BUN 14 6 - 23 mg/dL   Creatinine, Ser 0.92 0.40 - 1.50 mg/dL   Total Bilirubin 0.6 0.2 - 1.2 mg/dL   Alkaline Phosphatase 70 39 - 117 U/L   AST 17 0 - 37 U/L   ALT 16 0 - 53 U/L   Total Protein 7.4 6.0 - 8.3 g/dL   Albumin 5.2 3.5 - 5.2 g/dL   Calcium 9.7 8.4 - 10.5 mg/dL   GFR 97.16 >60.00 mL/min  Hemoglobin A1c  Result Value Ref Range   Hgb A1c MFr Bld 5.1 4.6 - 6.5 %  Lipid panel  Result Value Ref Range   Cholesterol 139 0 - 200 mg/dL   Triglycerides 73.0 0.0 - 149.0 mg/dL   HDL  52.80 >39.00 mg/dL   VLDL 16.114.6 0.0 - 09.640.0 mg/dL   LDL Cholesterol 72 0 - 99 mg/dL   Total CHOL/HDL Ratio 3    NonHDL 86.52   Sedimentation rate  Result Value Ref Range   Sed Rate 6 0 - 15 mm/hr  C-reactive protein  Result Value Ref Range   CRP <1.0 0.5 - 20.0 mg/dL

## 2018-12-13 ENCOUNTER — Other Ambulatory Visit: Payer: Self-pay

## 2018-12-13 ENCOUNTER — Encounter: Payer: Self-pay | Admitting: Family Medicine

## 2018-12-13 ENCOUNTER — Ambulatory Visit (INDEPENDENT_AMBULATORY_CARE_PROVIDER_SITE_OTHER): Payer: No Typology Code available for payment source | Admitting: Family Medicine

## 2018-12-13 VITALS — BP 118/92 | HR 90 | Temp 98.1°F | Resp 16 | Ht 70.0 in | Wt 158.0 lb

## 2018-12-13 DIAGNOSIS — R51 Headache: Secondary | ICD-10-CM | POA: Diagnosis not present

## 2018-12-13 DIAGNOSIS — Z131 Encounter for screening for diabetes mellitus: Secondary | ICD-10-CM

## 2018-12-13 DIAGNOSIS — H9202 Otalgia, left ear: Secondary | ICD-10-CM

## 2018-12-13 DIAGNOSIS — Z13220 Encounter for screening for lipoid disorders: Secondary | ICD-10-CM

## 2018-12-13 DIAGNOSIS — R519 Headache, unspecified: Secondary | ICD-10-CM

## 2018-12-13 MED ORDER — PREDNISONE 20 MG PO TABS: ORAL_TABLET | ORAL | 0 refills | Status: DC

## 2018-12-13 NOTE — Patient Instructions (Signed)
It was good to see you again today I will be in touch with your labs asap Please get your flu shot this fall As we discussed, I wonder if you may have a nerve issue called trigeminal neuralgia. This is a tricky diagnosis to make and the medication to treat it can have side effects.  I think we should have you see neurology for a consultation  In addition, please see your dentist asap to rule out a cavity or another source of dental pain  Let me know if any changes or worsening in the meantime

## 2018-12-14 ENCOUNTER — Encounter: Payer: Self-pay | Admitting: Family Medicine

## 2018-12-14 DIAGNOSIS — R519 Headache, unspecified: Secondary | ICD-10-CM

## 2018-12-14 DIAGNOSIS — H9202 Otalgia, left ear: Secondary | ICD-10-CM

## 2018-12-14 LAB — COMPREHENSIVE METABOLIC PANEL
ALT: 16 U/L (ref 0–53)
AST: 17 U/L (ref 0–37)
Albumin: 5.2 g/dL (ref 3.5–5.2)
Alkaline Phosphatase: 70 U/L (ref 39–117)
BUN: 14 mg/dL (ref 6–23)
CO2: 22 mEq/L (ref 19–32)
Calcium: 9.7 mg/dL (ref 8.4–10.5)
Chloride: 105 mEq/L (ref 96–112)
Creatinine, Ser: 0.92 mg/dL (ref 0.40–1.50)
GFR: 97.16 mL/min (ref >60.00)
Glucose, Bld: 110 mg/dL — ABNORMAL HIGH (ref 70–99)
Potassium: 4.5 mEq/L (ref 3.5–5.1)
Sodium: 138 mEq/L (ref 135–145)
Total Bilirubin: 0.6 mg/dL (ref 0.2–1.2)
Total Protein: 7.4 g/dL (ref 6.0–8.3)

## 2018-12-14 LAB — SEDIMENTATION RATE: Sed Rate: 6 mm/hr (ref 0–15)

## 2018-12-14 LAB — CBC
HCT: 43.7 % (ref 39.0–52.0)
Hemoglobin: 15.1 g/dL (ref 13.0–17.0)
MCHC: 34.6 g/dL (ref 30.0–36.0)
MCV: 85 fl (ref 78.0–100.0)
Platelets: 270 10*3/uL (ref 150.0–400.0)
RBC: 5.14 Mil/uL (ref 4.22–5.81)
RDW: 13.3 % (ref 11.5–15.5)
WBC: 4.5 10*3/uL (ref 4.0–10.5)

## 2018-12-14 LAB — LIPID PANEL
Cholesterol: 139 mg/dL (ref 0–200)
HDL: 52.8 mg/dL (ref >39.00)
LDL Cholesterol: 72 mg/dL (ref 0–99)
NonHDL: 86.52
Total CHOL/HDL Ratio: 3
Triglycerides: 73 mg/dL (ref 0.0–149.0)
VLDL: 14.6 mg/dL (ref 0.0–40.0)

## 2018-12-14 LAB — C-REACTIVE PROTEIN: CRP: <1 mg/dL (ref 0.5–20.0)

## 2018-12-14 LAB — HEMOGLOBIN A1C: Hgb A1c MFr Bld: 5.1 % (ref 4.6–6.5)

## 2018-12-19 NOTE — Telephone Encounter (Signed)
D/w Dr. Carlis Abbott Consider CT temporal bone to eval inner ear-- no contrast Or MRI of head with and without for more brain eval Options sent to pt

## 2018-12-22 NOTE — Addendum Note (Signed)
Addended by: Lamar Blinks C on: 12/22/2018 09:03 AM   Modules accepted: Orders

## 2018-12-26 ENCOUNTER — Other Ambulatory Visit: Payer: Self-pay

## 2018-12-26 ENCOUNTER — Ambulatory Visit
Admission: RE | Admit: 2018-12-26 | Discharge: 2018-12-26 | Disposition: A | Discharge status: Home / Self Care | Payer: No Typology Code available for payment source | Source: Ambulatory Visit | Attending: Family Medicine | Admitting: Family Medicine

## 2018-12-26 DIAGNOSIS — H9202 Otalgia, left ear: Secondary | ICD-10-CM

## 2018-12-26 DIAGNOSIS — R519 Headache, unspecified: Secondary | ICD-10-CM

## 2019-01-07 ENCOUNTER — Encounter: Payer: Self-pay | Admitting: Family Medicine

## 2019-01-07 DIAGNOSIS — H9202 Otalgia, left ear: Secondary | ICD-10-CM

## 2019-01-08 MED ORDER — CLINDAMYCIN HCL 300 MG PO CAPS: 300.0000 mg | ORAL_CAPSULE | Freq: Three times a day (TID) | ORAL | 0 refills | Status: DC

## 2019-02-26 ENCOUNTER — Encounter: Payer: Self-pay | Admitting: General Surgery

## 2019-03-08 ENCOUNTER — Other Ambulatory Visit: Payer: Self-pay | Admitting: *Deleted

## 2019-03-08 ENCOUNTER — Encounter: Payer: Self-pay | Admitting: *Deleted

## 2019-03-08 ENCOUNTER — Encounter: Payer: Self-pay | Admitting: Nurse Practitioner

## 2019-03-08 ENCOUNTER — Ambulatory Visit (INDEPENDENT_AMBULATORY_CARE_PROVIDER_SITE_OTHER): Payer: No Typology Code available for payment source | Admitting: Nurse Practitioner

## 2019-03-08 ENCOUNTER — Emergency Department (HOSPITAL_COMMUNITY)
Admission: EM | Admit: 2019-03-08 | Discharge: 2019-03-08 | Disposition: A | Discharge status: Home / Self Care | Payer: No Typology Code available for payment source | Attending: Emergency Medicine | Admitting: Emergency Medicine

## 2019-03-08 ENCOUNTER — Telehealth: Payer: Self-pay | Admitting: Nurse Practitioner

## 2019-03-08 ENCOUNTER — Other Ambulatory Visit: Payer: Self-pay

## 2019-03-08 ENCOUNTER — Encounter (HOSPITAL_COMMUNITY): Payer: Self-pay | Admitting: Emergency Medicine

## 2019-03-08 DIAGNOSIS — R1319 Other dysphagia: Secondary | ICD-10-CM

## 2019-03-08 DIAGNOSIS — K219 Gastro-esophageal reflux disease without esophagitis: Secondary | ICD-10-CM | POA: Diagnosis not present

## 2019-03-08 DIAGNOSIS — R1310 Dysphagia, unspecified: Secondary | ICD-10-CM

## 2019-03-08 LAB — COMPREHENSIVE METABOLIC PANEL
ALT: 17 U/L (ref 0–44)
AST: 18 U/L (ref 15–41)
Albumin: 4.8 g/dL (ref 3.5–5.0)
Alkaline Phosphatase: 70 U/L (ref 38–126)
Anion gap: 9 (ref 5–15)
BUN: 11 mg/dL (ref 6–20)
CO2: 25 mmol/L (ref 22–32)
Calcium: 9.2 mg/dL (ref 8.9–10.3)
Chloride: 104 mmol/L (ref 98–111)
Creatinine, Ser: 0.9 mg/dL (ref 0.61–1.24)
GFR calc Af Amer: >60 mL/min (ref >60)
GFR calc non Af Amer: >60 mL/min (ref >60)
Glucose, Bld: 90 mg/dL (ref 70–99)
Potassium: 3.9 mmol/L (ref 3.5–5.1)
Sodium: 138 mmol/L (ref 135–145)
Total Bilirubin: 1 mg/dL (ref 0.3–1.2)
Total Protein: 7.5 g/dL (ref 6.5–8.1)

## 2019-03-08 LAB — URINALYSIS, ROUTINE W REFLEX MICROSCOPIC
Bilirubin Urine: NEGATIVE
Glucose, UA: NEGATIVE mg/dL
Hgb urine dipstick: NEGATIVE
Ketones, ur: NEGATIVE mg/dL
Leukocytes,Ua: NEGATIVE
Nitrite: NEGATIVE
Protein, ur: NEGATIVE mg/dL
Specific Gravity, Urine: 1.008 (ref 1.005–1.030)
pH: 8 (ref 5.0–8.0)

## 2019-03-08 LAB — CBC WITH DIFFERENTIAL/PLATELET
Abs Immature Granulocytes: 0.01 10*3/uL (ref 0.00–0.07)
Basophils Absolute: 0 10*3/uL (ref 0.0–0.1)
Basophils Relative: 1 %
Eosinophils Absolute: 0 10*3/uL (ref 0.0–0.5)
Eosinophils Relative: 0 %
HCT: 44.8 % (ref 39.0–52.0)
Hemoglobin: 15.2 g/dL (ref 13.0–17.0)
Immature Granulocytes: 0 %
Lymphocytes Relative: 18 %
Lymphs Abs: 1 10*3/uL (ref 0.7–4.0)
MCH: 29.7 pg (ref 26.0–34.0)
MCHC: 33.9 g/dL (ref 30.0–36.0)
MCV: 87.5 fL (ref 80.0–100.0)
Monocytes Absolute: 0.3 10*3/uL (ref 0.1–1.0)
Monocytes Relative: 5 %
Neutro Abs: 4.3 10*3/uL (ref 1.7–7.7)
Neutrophils Relative %: 76 %
Platelets: 252 10*3/uL (ref 150–400)
RBC: 5.12 MIL/uL (ref 4.22–5.81)
RDW: 13 % (ref 11.5–15.5)
WBC: 5.6 10*3/uL (ref 4.0–10.5)
nRBC: 0 % (ref 0.0–0.2)

## 2019-03-08 MED ORDER — OMEPRAZOLE 40 MG PO CPDR: 40.0000 mg | DELAYED_RELEASE_CAPSULE | Freq: Every day | ORAL | 3 refills | Status: DC

## 2019-03-08 MED ORDER — LORAZEPAM 1 MG PO TABS: 1.0000 mg | ORAL_TABLET | Freq: Three times a day (TID) | ORAL | 0 refills | Status: DC | PRN

## 2019-03-08 NOTE — ED Triage Notes (Signed)
Pt states that he has been having problems with food sitting in his chest and he vomits a lot. He has seen GI and they were trying him on some medication but it is getting worse.

## 2019-03-08 NOTE — Telephone Encounter (Signed)
Spoke to the ER PA. Patient seems to be worried his symptoms are some severe pathology such as cancer and doesn't want to wait for EGD so long. I discussed our scheduling limitations. Also noted that CT Abdomen in May of this year did not find anything. We felt ordering a BPE in the interim may help him feel better.  I will put in the order. Cc: MS for scheduling.

## 2019-03-08 NOTE — Patient Instructions (Addendum)
Your health issues we discussed today were:   History of GERD (reflux/heartburn) with dysphagia (swallowing difficulties): 1. I have sent in Prilosec 40 mg to your pharmacy.  Take this once a day, first thing in the morning before eating 2. We will schedule you for an upper endoscopy with possible dilation 3. Avoid trigger foods that make your symptoms worse 4. Diet recommendations as per below 5. Call us if you have any worsening or severe symptoms 6. As we discussed if you have severe dysphagia with inability of anything to pass forward or regurgitate back up you can proceed to the ER  Overall I recommend:  1. Return for follow-up and 3 months 2. Call us if you have any questions or concerns.   Because of recent events of COVID-19 ("Coronavirus"), follow CDC recommendations:  1. Wash your hand frequently 2. Avoid touching your face 3. Stay away from people who are sick 4. If you have symptoms such as fever, cough, shortness of breath then call your healthcare provider for further guidance 5. If you are sick, STAY AT HOME unless otherwise directed by your healthcare provider. 6. Follow directions from state and national officials regarding staying safe   At [facility] we value your feedback. You may receive a survey about your visit today. Please share your experience as we strive to create trusting relationships with our patients to provide genuine, compassionate, quality care.  We appreciate your understanding and patience as we review any laboratory studies, imaging, and other diagnostic tests that are ordered as we care for you. Our office policy is 5 business days for review of these results, and any emergent or urgent results are addressed in a timely manner for your best interest. If you do not hear from our office in 1 week, please contact us.   We also encourage the use of MyChart, which contains your medical information for your review as well. If you are not  enrolled in this feature, an access code is on this after visit summary for your convenience. Thank you for allowing us to be involved in your care.  It was great to see you today!  I hope you have a Happy Thanksgiving!!       Dysphagia  Dysphagia is trouble swallowing. This condition occurs when solids and liquids stick in a person's throat on the way down to the stomach, or when food takes longer to get to the stomach. You may have problems swallowing food, liquids, or both. You may also have pain while trying to swallow. It may take you more time and effort to swallow something. What are the causes? This condition is caused by:  Problems with the muscles. They may make it difficult for you to move food and liquids through the tube that connects your mouth to your stomach (esophagus). You may have ulcers, scar tissue, or inflammation that blocks the normal passage of food and liquids. Causes of these problems include: ? Acid reflux from your stomach into your esophagus (gastroesophageal reflux). ? Infections. ? Radiation treatment for cancer. ? Medicines taken without enough fluids to wash them down into your stomach.  Nerve problems. These prevent signals from being sent to the muscles of your esophagus to squeeze (contract) and move what you swallow down to your stomach.  Globus pharyngeus. This is a common problem that involves feeling like something is stuck in the throat or a sense of trouble with swallowing even though nothing is wrong with the swallowing passages.  Stroke.  This can affect the nerves and make it difficult to swallow.  Certain conditions, such as cerebral palsy or Parkinson disease. What are the signs or symptoms? Common symptoms of this condition include:  A feeling that solids or liquids are stuck in your throat on the way down to the stomach.  Food taking too long to get to the stomach. Other symptoms include:  Food moving back from your stomach to your  mouth (regurgitation).  Noises coming from your throat.  Chest discomfort with swallowing.  A feeling of fullness when swallowing.  Drooling, especially when the throat is blocked.  Pain while swallowing.  Heartburn.  Coughing or gagging while trying to swallow. How is this diagnosed? This condition is diagnosed by:  Barium X-ray. In this test, you swallow a white substance (contrast medium)that sticks to the inside of your esophagus. X-ray images are then taken.  Endoscopy. In this test, a flexible telescope is inserted down your throat to look at your esophagus and your stomach.  CT scans and MRI. How is this treated? Treatment for dysphagia depends on the cause of the condition:  If the dysphagia is caused by acid reflux or infection, medicines may be used. They may include antibiotics and heartburn medicines.  If the dysphagia is caused by problems with your muscles, swallowing therapy may be used to help you strengthen your swallowing muscles. You may have to do specific exercises to strengthen the muscles or stretch them.  If the dysphagia is caused by a blockage or mass, procedures to remove the blockage may be done. You may need surgery and a feeding tube. You may need to make diet changes. Ask your health care provider for specific instructions. Follow these instructions at home: Eating and drinking  Try to eat soft food that is easier to swallow.  Follow any diet changes as told by your health care provider.  Cut your food into small pieces and eat slowly.  Eat and drink only when you are sitting upright.  Do not drink alcohol or caffeine. If you need help quitting, ask your health care provider. General instructions  Check your weight every day to make sure you are not losing weight.  Take over-the-counter and prescription medicines only as told by your health care provider.  If you were prescribed an antibiotic medicine, take it as told by your health  care provider. Do not stop taking the antibiotic even if you start to feel better.  Do not use any products that contain nicotine or tobacco, such as cigarettes and e-cigarettes. If you need help quitting, ask your health care provider.  Keep all follow-up visits as told by your health care provider. This is important. Contact a health care provider if:  You lose weight because you cannot swallow.  You cough when you drink liquids (aspiration).  You cough up partially digested food. Get help right away if:  You cannot swallow your saliva.  You have shortness of breath or a fever, or both.  You have a hoarse voice and also have trouble swallowing. Summary  Dysphagia is trouble swallowing. This condition occurs when solids and liquids stick in a person's throat on the way down to the stomach, or when food takes longer to get to the stomach.  Dysphagia has many possible causes and symptoms.  Treatment for dysphagia depends on the cause of the condition. This information is not intended to replace advice given to you by your health care provider. Make sure you discuss any questions  you have with your health care provider. Document Released: 04/16/2000 Document Revised: 04/01/2017 Document Reviewed: 04/08/2016 Elsevier Patient Education  2020 [employer]    Dysphagia Eating Plan, Minced and Moist Foods This eating plan is for people with moderate swallowing problems who are transitioning from pureed to solid foods. Moist and minced foods are soft and cut into very small chunks so that they can be swallowed safely. On this eating plan, you may be instructed to drink liquids that are thickened. Work with your health care provider and your diet and nutrition specialist (dietitian) to make sure that you are following the diet safely and getting all the nutrients you need. What are tips for following this plan? General guidelines for foods   You may eat foods that are soft and moist.   Always test food texture before taking a bite. Poke food with a fork or spoon to make sure it is tender.  Take small bites. Each bite should be smaller than your little finger nail (about 4 mm by 4 mm).  If you were on a pureed food eating plan, you may still eat any of the foods included in that diet.  Avoid foods that are dry, hard, sticky, chewy, coarse, or crunchy.  Avoid foods that separate into thin liquids and solids, such as cereal with milk or chunky soups.  Avoid liquids that have seeds or chunks.  If instructed by your health care provider, thicken liquids. Follow your health care provider's instructions about what products to use, how to do this, and to what thickness. ? You may use a commercial thickener, rice cereal, or potato flakes. ? Thickened liquids are usually a "pudding-like" consistency, or they may be as thick as honey or thick enough to eat with a spoon. Cooking  You may need to use a blender, whisk, or masher to soften some of your foods.  To moisten foods, you may add liquids while you are blending, mashing, or grinding your foods to the right consistency. These liquids include gravies, sauces, vegetable or fruit juice, milk, half and half, or water.  Reheat foods slowly to prevent a tough crust from forming. Meal planning  Eat a variety of foods in order to get all the nutrients you need.  Follow your meal plan as told by your health care provider or dietitian. What foods are allowed? Grains Soaked soft breads without nuts or seeds. Pancakes, sweet rolls, pastries, and [place] toast that have been moistened with syrup or sauce. Well-cooked pasta, noodles, rice, and bread dressing in very small pieces and thick sauce. Soft dumplings or spaetzle in very small pieces and butter or gravy. Soft-cooked cereals. Vegetables Very soft, well-cooked vegetables in very small pieces. Soft-cooked, mashed potatoes. Thickened vegetable juice. Fruits Canned or cooked  fruits that are soft or moist and do not have skin or seeds. Fresh, soft bananas. Thickened fruit juices. Meat and other protein foods Tender, moist, and finely minced or ground meats or poultry. Moist meatballs or meatloaf. Fish without bones. Scrambled, poached, or soft-cooked eggs. Tofu. Tempeh and meat alternatives in very small pieces. Well-cooked, moistened and mashed beans, baked beans, peas, and other legumes. Dairy Thickened milk. Cream cheese. Yogurt. Cottage cheese. Sour cream. Fats and oils Butter. Margarine. Cream for cereal, depending on liquid consistency allowed. Gravy. Cream sauces. Mayonnaise. Sweets and desserts Pudding. Custard. Ice cream and sherbet. Whipped toppings. Soft, moist cakes. Icing. Jelly. Jams and preserves without seeds. Seasoning and other foods Sauces and salsas that have soft chunks  that are smaller than 65mm. Salad dressings. Casseroles with small pieces of tender meat. All seasonings and sweeteners. Beverages Anything prepared at the thickness recommended by your dietitian. What foods are not allowed? Grains Breads that are hard or have nuts or seeds. Dry biscuits, pancakes, waffles, and bread dressing. Coarse cereals. Cereals that have nuts, seeds, dried fruits, or coconut. Sticky rice. Large pieces of pasta. Vegetables All raw vegetables. Tough, fibrous, chewy, or stringy cooked vegetables, such as celery, peas, broccoli, cabbage, Brussels sprouts, and asparagus. Potato skins. Potato and other vegetable chips. Fried or French-fried potatoes. Cooked corn and peas. Fruits Hard, crunchy, stringy, high-pulp, and juicy raw fruits such as apples, pineapple, papaya, and watermelon. Fruits with skins and seeds, such as grapes. Dried fruit and fruit leather. Meats and other protein foods Large pieces of meat. Dry, tough meats, such as bacon, sausage, and hot dogs. Chicken, [place], or fish with skin and bones. Crunchy peanut butter. Nuts. Seeds. Dairy Yogurt with  nuts, seeds, or large chunks. Large chunks of cheese. Frozen desserts and milk consistency not allowed by your dietitian. Sweets and desserts Coarse, hard, chewy, or sticky desserts. Any dessert with nuts, seeds, coconut, pineapple, or dried fruit. Bread pudding. Seasoning and other foods Soups and casseroles with large chunks. Sandwiches. Pizza. Summary  Moist and minced foods can be helpful for people with moderate swallowing problems.  On the dysphagia eating plan, you may eat foods that are soft, moist, and cut into pieces smaller than 36mm by 33mm.  You may be instructed to thicken liquids. Follow your health care provider's instructions about how to do this and to what consistency. This information is not intended to replace advice given to you by your health care provider. Make sure you discuss any questions you have with your health care provider. Document Released: 04/19/2005 Document Revised: 08/10/2018 Document Reviewed: 07/30/2016 Elsevier Patient Education  2020 [employer].

## 2019-03-08 NOTE — Progress Notes (Signed)
Primary Care Physician:  Pearline Cables, MD Primary Gastroenterologist:  Dr. Jena Gauss  Chief Complaint  Patient presents with   Dysphagia    food, liquids, meds    HPI:   Ryan Dougherty is a 28 y.o. male who presents on referral from general surgery for dysphagia symptoms.  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  He was taken to the OR for exploratory laparoscopy to laparotomy with reduction of internal hernia and closure of the mesenteric defect.  He did well after then.  He called the surgeon's office 03/01/2019 describing solid food and pill dysphagia with odynophagia for which she was referred to GI.  It appears the patient had a flexible sigmoidoscopy on 02/06/2015.  The entire examined portion of the colon was normal without specimens found.  Recommend Linzess 290 mcg daily and follow-up in 4 weeks.  This is completed by Dr. Sharrell Ku at [facility].  Today he states he's doing ok overall. He is having persistent dysphagia symptoms with solid foods, liquids, and pills. Symptoms started about about 1.5-2 months ago. This started with eating a shake which "got hung up" with sensation of fullness in the chest/throat. A day later he awoke with a hiccuping feeling and crushing chest pain, which eventually passed. After that, anything he ate caused slow passing and significant odynophagia. He tried to take one of his medications and felt the pill sitting in the middle of his chest. Started having GERD symptoms since then. Dysphagia subsided at times, but started back about 2-4 weeks ago, significantly worse 2 weeks ago. Still having solid food dysphagia with every meal and associated odynophagia. Also still with pill dysphagia. Currently eating cheese and crackers, gummy bears, bread. Chews food really well. Specific offenders includes pasta. Occasional  regurgitation. Occasional epigastric pain, but not often. Has nausea "a little bit every day" and was vomiting once every couple days but has since cut back on his eating; based on his description he seems to be having more postprandial regurgitation rather than true vomiting. No other abdominal pain, hematochezia, melena, fever, chills. Has lost about 10 lbs due to not eating as much. Denies URI or flu-like symptoms. Denies loss of sense of taste or smell. Has daily dyspnea, worse in the morning. Associated chest pain. Told the surgeon. This is intermittent. Denies chest pain, dizziness, lightheadedness, syncope, near syncope. Denies any other upper or lower GI symptoms.  Not currently on any medications.  Past Medical History:  Diagnosis Date   GERD (gastroesophageal reflux disease)     Past Surgical History:  Procedure Laterality Date   LAPAROSCOPY N/A 09/05/2018   Procedure: LAPAROSCOPY DIAGNOSTIC CONVERTED TO LAPAROTOMY (AT 0848) WITH REDUCTION OF INTERNAL HERNIA  AND CLOSURE OF MESENTERIC DEFECT;  Surgeon: Lucretia Roers, MD;  Location: [facility];  Service: General;  Laterality: N/A;    No current outpatient medications on file.   No current facility-administered medications for this visit.     Allergies as of 03/08/2019 - Review Complete 03/08/2019  Allergen Reaction Noted   Penicillins Rash 01/06/2012   Codeine Rash 01/06/2013   Roxicodone [oxycodone hcl] Other (See Comments) 09/08/2018    Family History  Problem Relation Age of Onset   Heart attack Maternal Grandfather 22   Hypertension Maternal Grandfather    Breast cancer Maternal Grandmother    Arthritis Maternal Grandmother  Colon cancer Neg Hx    Gastric cancer Neg Hx    Esophageal cancer Neg Hx     Social History   Socioeconomic History   Marital status: Single    Spouse name: Not on file   Number of children: Not on file   Years of education: Not on file   Highest education level: Not on  file  Occupational History   Not on file  Social Needs   Financial resource strain: Not on file   Food insecurity    Worry: Not on file    Inability: Not on file   Transportation needs    Medical: Not on file    Non-medical: Not on file  Tobacco Use   Smoking status: Never Smoker   Smokeless tobacco: Never Used  Substance and Sexual Activity   Alcohol use: Yes    Comment: rare   Drug use: No   Sexual activity: Not on file  Lifestyle   Physical activity    Days per week: Not on file    Minutes per session: Not on file   Stress: Not on file  Relationships   Social connections    Talks on phone: Not on file    Gets together: Not on file    Attends religious service: Not on file    Active member of club or organization: Not on file    Attends meetings of clubs or organizations: Not on file    Relationship status: Not on file   Intimate partner violence    Fear of current or ex partner: Not on file    Emotionally abused: Not on file    Physically abused: Not on file    Forced sexual activity: Not on file  Other Topics Concern   Not on file  Social History Narrative   Not on file    Review of Systems: General: Negative for anorexia, weight loss, fever, chills, fatigue, weakness. ENT: Negative for hoarseness, difficulty swallowing. CV: Negative for chest pain, angina, palpitations, peripheral edema.  Respiratory: Negative for dyspnea at rest, cough, sputum, wheezing.  GI: See history of present illness. MS: Negative for joint pain, low back pain.  Derm: Negative for rash or itching.  Endo: Negative for unusual weight change.  Heme: Negative for bruising or bleeding. Allergy: Negative for rash or hives.    Physical Exam: BP 124/75    Pulse 91    Temp (!) 96.9 F (36.1 C) (Temporal)    Ht 5\' 10"  (1.778 m)    Wt 155 lb (70.3 kg)    BMI 22.24 kg/m  General:   Alert and oriented. Pleasant and cooperative. Well-nourished and well-developed.  Head:   Normocephalic and atraumatic. Eyes:  Without icterus, sclera clear and conjunctiva pink.  Ears:  Normal auditory acuity. Cardiovascular:  S1, S2 present without murmurs appreciated. Extremities without clubbing or edema. Respiratory:  Clear to auscultation bilaterally. No wheezes, rales, or rhonchi. No distress.  Gastrointestinal:  +BS, soft, non-tender and non-distended. No HSM noted. No guarding or rebound. No masses appreciated.  Rectal:  Deferred  Musculoskalatal:  Symmetrical without gross deformities. Skin:  Intact without significant lesions or rashes. Well healed surgical incision noted sub-umbilical area. Neurologic:  Alert and oriented x4;  grossly normal neurologically. Psych:  Alert and cooperative. Normal mood and affect. Heme/Lymph/Immune: No excessive bruising noted.    03/08/2019 10:10 AM   Disclaimer: This note was dictated with voice recognition software. Similar sounding words can inadvertently be transcribed and may not  This note was partially dictated with voice recognition software. Similar sounding words can be transcribed inadequately or may not  be corrected upon review.

## 2019-03-08 NOTE — Telephone Encounter (Signed)
Once patient is d/c'd from ER will call to schedule BPE.

## 2019-03-08 NOTE — Discharge Instructions (Addendum)
Your lab tests today are reassuring with no sign of dehydration and your calorie intake appears adequate, also no concerns with liver or pancreas disease which can also cause upper abdominal discomfort.  Get the omeprazole started (as prescribed by your GI specialist this morning).  Be aware that it may take a few days for this medicine to start making a significant difference.  You may additionally try the ativan I have prescribed - this is a medicine that can relax the muscles in your esophagus which may help make swallowing easier for you.  However, this medicine may make you drowsy - do not drive within 4 hours of taking this medicine.

## 2019-03-08 NOTE — ED Provider Notes (Signed)
[facility] EMERGENCY DEPARTMENT Provider Note   CSN: [id number] Arrival date & time: 03/08/19  1058     History   Chief Complaint Chief Complaint  Patient presents with  . Gastroesophageal Reflux    HPI Ryan Dougherty is a 28 y.o. 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  He denies classic heartburn symptoms although has had this problem in the past.  He describes sensation of heaviness in his chest and abdomen with meals with slow passage of intake and occasional regurgitation including liquids. He denies fevers or chills, denies shortness of breath, no current abdominal pain, but occasional epigastric pain.  His sx are always triggered by oral intake, not exertion.  He denies weakness, dizziness, constipation or diarrhea.  He does report a 10 lb weight loss over the past 2 months.   He was seen by Walden Field, NP this am and scheduled for an upper endoscopy, first available appt is in January.  He was also prescribed prilosec which he has not started yet.      HPI  Past Medical History:  Diagnosis Date  . GERD (gastroesophageal reflux disease)     Patient Active Problem List   Diagnosis Date Noted  . Dysphagia 03/08/2019  . Internal hernia 09/05/2018  . Other complete intestinal obstruction ([place])   . Palpitations 03/21/2013  . Fatigue 03/21/2013  . Dyspnea 03/21/2013    Past Surgical History:  Procedure Laterality Date  . LAPAROSCOPY N/A 09/05/2018   Procedure: LAPAROSCOPY DIAGNOSTIC CONVERTED TO LAPAROTOMY (AT 0848) WITH REDUCTION OF INTERNAL HERNIA  AND CLOSURE OF MESENTERIC DEFECT;  Surgeon: Virl Cagey, MD;  Location: [facility];  Service: General;  Laterality: N/A;        Home Medications    Prior to Admission  medications   Medication Sig Start Date End Date Taking? Authorizing Provider  LORazepam (ATIVAN) 1 MG tablet Take 1 tablet (1 mg total) by mouth every 8 (eight) hours as needed (esophageal spasm). 03/08/19   Evalee Jefferson, PA-C  omeprazole (PRILOSEC) 40 MG capsule Take 1 capsule (40 mg total) by mouth daily. Patient not taking: Reported on 03/08/2019 03/08/19   Carlis Stable, NP    Family History Family History  Problem Relation Age of Onset  . Heart attack Maternal Grandfather 79  . Hypertension Maternal Grandfather   . Breast cancer Maternal Grandmother   . Arthritis Maternal Grandmother   . Colon cancer Neg Hx   . Gastric cancer Neg Hx   . Esophageal cancer Neg Hx     Social History Social History   Tobacco Use  . Smoking status: Never Smoker  . Smokeless tobacco: Never Used  Substance Use Topics  . Alcohol use: Yes    Comment: rare  . Drug use: No     Allergies   Penicillins, Codeine, and Roxicodone [oxycodone hcl]   Review of Systems Review of Systems  Constitutional: Negative for activity change and fever.  HENT: Negative for congestion and sore throat.   Eyes: Negative.   Respiratory: Negative for chest tightness and shortness of breath.   Cardiovascular: Negative for chest pain.  Gastrointestinal: Negative for abdominal distention, abdominal pain, constipation, diarrhea, nausea and vomiting.       Negative except as mentioned in HPI.   Genitourinary:  Negative.  Negative for dysuria.  Musculoskeletal: Negative for arthralgias, joint swelling and neck pain.  Skin: Negative.  Negative for rash and wound.  Neurological: Negative for dizziness, weakness, light-headedness, numbness and headaches.  Psychiatric/Behavioral: Negative.   All other systems reviewed and are negative.    Physical Exam Updated Vital Signs Ht 5\' 10"  (1.778 m)   Wt 70.3 kg   BMI 22.24 kg/m   Physical Exam Vitals signs and nursing note reviewed.  Constitutional:      Appearance: He  is well-developed.  HENT:     Head: Normocephalic and atraumatic.  Eyes:     Conjunctiva/sclera: Conjunctivae normal.  Neck:     Musculoskeletal: Normal range of motion.  Cardiovascular:     Rate and Rhythm: Normal rate and regular rhythm.     Heart sounds: Normal heart sounds.  Pulmonary:     Effort: Pulmonary effort is normal.     Breath sounds: Normal breath sounds. No wheezing.  Abdominal:     General: Bowel sounds are normal. There is no distension.     Palpations: Abdomen is soft.     Tenderness: There is no abdominal tenderness. There is no guarding.  Musculoskeletal: Normal range of motion.  Skin:    General: Skin is warm and dry.  Neurological:     Mental Status: He is alert.      ED Treatments / Results  Labs (all labs ordered are listed, but only abnormal results are displayed) Labs Reviewed  URINALYSIS, ROUTINE W REFLEX MICROSCOPIC - Abnormal; Notable for the following components:      Result Value   Color, Urine STRAW (*)    All other components within normal limits  CBC WITH DIFFERENTIAL/PLATELET  COMPREHENSIVE METABOLIC PANEL    EKG None  Radiology No results found.  Procedures Procedures (including critical care time)  Medications Ordered in ED Medications - No data to display   Initial Impression / Assessment and Plan / ED Course  I have reviewed the triage vital signs and the nursing notes.  Pertinent labs & imaging results that were available during my care of the patient were reviewed by me and considered in my medical decision making (see chart for details).        Labs reviewed with no abnormal findings, specifically normal albumin, normal LFT's and lipase, no sign of dehydration clinically. Reassured pt it appears he is maintaining hydration/ adequate nutrition.  Suggested trial of ativan which may help with dysphagia sx.  He is concerned there may be a tumor or other obstruction as source of sx. Call to , NP - discussed pt's  concerns. He will order a barium swallow test as outpatient, but unlikely to be diagnostic.  Pt is already on their cancellation list for earlier EGD possibly.  This info was discussed with pt which gave reassurance.  Prn f/u anticipated.   Final Clinical Impressions(s) / ED Diagnoses   Final diagnoses:  Gastroesophageal reflux disease, unspecified whether esophagitis present    ED Discharge Orders         Ordered    LORazepam (ATIVAN) 1 MG tablet  Every 8 hours PRN     03/08/19 1543           13/05/20, PA-C 03/08/19 1631    13/05/20, MD 03/08/19 1725

## 2019-03-08 NOTE — Assessment & Plan Note (Signed)
The patient describes significant dysphagia symptoms including solid food dysphagia, liquid dysphagia, pill dysphagia.  Also notes regurgitation.  This become progressively worse as of the past 3 weeks.  It started about 2 to 3 months ago.  He does have a history of GERD, although no overt GERD symptoms currently.  I will start him on Prilosec 40 mg daily and recommend trigger avoidance.  I will set him up for an upper endoscopy with possible dilation.  Recommend a dysphagia 2 diet due to his significant symptoms.  ER precautions were discussed.  Recommendations to follow.  Proceed with EGD +/- dilation on propofol/MAC with Dr. Gala Romney in near future: the risks, benefits, and alternatives have been discussed with the patient in detail. The patient states understanding and desires to proceed.  The patient is not currently on any medications.  Denies significant alcohol and drug use.  Given his young age we will plan for the procedure on propofol/MAC to promote adequate sedation.

## 2019-03-09 ENCOUNTER — Other Ambulatory Visit (HOSPITAL_COMMUNITY)
Admission: RE | Admit: 2019-03-09 | Discharge: 2019-03-09 | Disposition: A | Discharge status: Home / Self Care | Payer: No Typology Code available for payment source | Source: Ambulatory Visit | Attending: Internal Medicine | Admitting: Internal Medicine

## 2019-03-09 ENCOUNTER — Encounter: Payer: Self-pay | Admitting: *Deleted

## 2019-03-09 ENCOUNTER — Encounter (HOSPITAL_COMMUNITY)
Admission: RE | Admit: 2019-03-09 | Discharge: 2019-03-09 | Disposition: A | Discharge status: Home / Self Care | Payer: PRIVATE HEALTH INSURANCE | Source: Ambulatory Visit | Attending: Internal Medicine | Admitting: Internal Medicine

## 2019-03-09 ENCOUNTER — Other Ambulatory Visit: Payer: Self-pay

## 2019-03-09 DIAGNOSIS — Z20828 Contact with and (suspected) exposure to other viral communicable diseases: Secondary | ICD-10-CM | POA: Diagnosis not present

## 2019-03-09 DIAGNOSIS — Z01812 Encounter for preprocedural laboratory examination: Secondary | ICD-10-CM | POA: Insufficient documentation

## 2019-03-09 LAB — SARS CORONAVIRUS 2 (TAT 6-24 HRS): SARS Coronavirus 2: NEGATIVE

## 2019-03-09 NOTE — Telephone Encounter (Signed)
Called [facility] and spoke with [place]. No PA is required. Just a pre notification of procedure. I have provided her with the information. Call ref# [phone]. FYI to EG

## 2019-03-09 NOTE — Telephone Encounter (Signed)
RMR had a cancellation for 11/9 at 12:30pm. Called patient and he was agreeable to move up procedure. He is going today at 12:15pm for covid testing. Aware pre-op will be done via phone call per kim in endo. Patient has been sent EGD instructions via [place].

## 2019-03-09 NOTE — Telephone Encounter (Signed)
Noted  

## 2019-03-09 NOTE — Telephone Encounter (Signed)
BPE scheduled for 11/10 at 10:00am, arrival 9:45am, npo 3 hrs prior  Called pt, Left detail appt information on named VM (also ok per dpr on file)

## 2019-03-12 ENCOUNTER — Telehealth: Payer: Self-pay | Admitting: Internal Medicine

## 2019-03-12 NOTE — Telephone Encounter (Signed)
Called pt, EGD/-/+DIL w/Propofol w/RMR rescheduled to next available 06/04/19 at 2:00pm. LMOVM for endo scheduler.  FYI sent to EG to inform of pt rescheduling procedure.

## 2019-03-12 NOTE — Telephone Encounter (Signed)
COVID test 06/01/19 at 2:20pm, pre-op 2:45pm. Appt letter mailed with procedure instructions.

## 2019-03-12 NOTE — Telephone Encounter (Signed)
Pt called to reschedule procedure, He was on for today with RMR. [phone]

## 2019-03-13 ENCOUNTER — Ambulatory Visit (HOSPITAL_COMMUNITY)
Admission: RE | Admit: 2019-03-13 | Discharge: 2019-03-13 | Disposition: A | Discharge status: Home / Self Care | Payer: No Typology Code available for payment source | Source: Ambulatory Visit | Attending: Nurse Practitioner | Admitting: Nurse Practitioner

## 2019-03-13 ENCOUNTER — Other Ambulatory Visit: Payer: Self-pay

## 2019-03-13 DIAGNOSIS — R1319 Other dysphagia: Secondary | ICD-10-CM

## 2019-03-13 DIAGNOSIS — R1310 Dysphagia, unspecified: Secondary | ICD-10-CM | POA: Diagnosis present

## 2019-03-13 NOTE — Telephone Encounter (Signed)
Noted  

## 2019-03-15 ENCOUNTER — Telehealth: Payer: Self-pay

## 2019-03-15 NOTE — Telephone Encounter (Signed)
Called [facility], spoke to [place]. Informed of new date for EGD is 06/04/19.

## 2019-05-11 ENCOUNTER — Telehealth: Payer: Self-pay | Admitting: Nurse Practitioner

## 2019-05-11 DIAGNOSIS — R1011 Right upper quadrant pain: Secondary | ICD-10-CM

## 2019-05-11 NOTE — Telephone Encounter (Signed)
Received MyChart message from patient with RUQ discomfort. Dysphagia improving. Recommended RUQ U/S for biliary evaluation.

## 2019-05-15 ENCOUNTER — Other Ambulatory Visit (HOSPITAL_COMMUNITY): Payer: No Typology Code available for payment source

## 2019-05-21 ENCOUNTER — Telehealth: Payer: Self-pay | Admitting: *Deleted

## 2019-05-21 NOTE — Telephone Encounter (Signed)
LMOVM for pt 

## 2019-05-21 NOTE — Telephone Encounter (Signed)
Spoke with patient. Aware d/t change in schedule his new procedure time on 2/1 will be 12:15pm. Aware when he goes for his pre-op/covid test appt they will advise arrival time. Also advised new prep instructions. Letter mailed. Endo aware.

## 2019-05-23 ENCOUNTER — Other Ambulatory Visit: Payer: Self-pay

## 2019-05-23 ENCOUNTER — Ambulatory Visit (HOSPITAL_COMMUNITY)
Admission: RE | Admit: 2019-05-23 | Discharge: 2019-05-23 | Disposition: A | Discharge status: Home / Self Care | Payer: Self-pay | Source: Ambulatory Visit | Attending: Nurse Practitioner | Admitting: Nurse Practitioner

## 2019-05-23 DIAGNOSIS — R1011 Right upper quadrant pain: Secondary | ICD-10-CM | POA: Insufficient documentation

## 2019-05-31 ENCOUNTER — Telehealth: Payer: Self-pay | Admitting: Internal Medicine

## 2019-05-31 NOTE — Telephone Encounter (Signed)
Pt is scheduled procedure with RMR on Monday 06/04/2019 and wants to reschedule. [phone]

## 2019-05-31 NOTE — Telephone Encounter (Signed)
Spoke to EG, per EG pt will need OV with RMR to discuss rescheduling procedure since he has cancelled procedure  twice.  Called pt, OV w/RMR scheduled for 07/10/19.   Endo scheduler informed to cancel procedure.

## 2019-06-01 ENCOUNTER — Other Ambulatory Visit (HOSPITAL_COMMUNITY)
Admission: RE | Admit: 2019-06-01 | Discharge: 2019-06-01 | Disposition: A | Discharge status: Home / Self Care | Payer: Self-pay | Source: Ambulatory Visit | Attending: Internal Medicine | Admitting: Internal Medicine

## 2019-06-01 ENCOUNTER — Encounter (HOSPITAL_COMMUNITY): Admission: RE | Admit: 2019-06-01 | Payer: No Typology Code available for payment source | Source: Ambulatory Visit

## 2019-06-04 ENCOUNTER — Encounter (HOSPITAL_COMMUNITY): Admission: RE | Payer: Self-pay | Source: Home / Self Care

## 2019-06-04 ENCOUNTER — Ambulatory Visit (HOSPITAL_COMMUNITY)
Admission: RE | Admit: 2019-06-04 | Payer: PRIVATE HEALTH INSURANCE | Source: Home / Self Care | Admitting: Internal Medicine

## 2019-06-04 SURGERY — ESOPHAGOGASTRODUODENOSCOPY (EGD) WITH PROPOFOL
Anesthesia: Monitor Anesthesia Care

## 2019-06-12 ENCOUNTER — Ambulatory Visit: Payer: No Typology Code available for payment source | Admitting: Nurse Practitioner

## 2019-07-03 ENCOUNTER — Ambulatory Visit: Payer: Self-pay | Admitting: Nurse Practitioner

## 2019-07-10 ENCOUNTER — Ambulatory Visit: Payer: Self-pay | Admitting: Internal Medicine

## 2019-08-14 ENCOUNTER — Encounter: Payer: Self-pay | Admitting: Internal Medicine

## 2019-08-14 ENCOUNTER — Other Ambulatory Visit: Payer: Self-pay

## 2019-08-14 ENCOUNTER — Ambulatory Visit (INDEPENDENT_AMBULATORY_CARE_PROVIDER_SITE_OTHER): Payer: PRIVATE HEALTH INSURANCE | Admitting: Internal Medicine

## 2019-08-14 VITALS — BP 125/67 | HR 70 | Temp 97.0°F | Ht 70.0 in | Wt 167.6 lb

## 2019-08-14 DIAGNOSIS — R1013 Epigastric pain: Secondary | ICD-10-CM

## 2019-08-14 DIAGNOSIS — R1319 Other dysphagia: Secondary | ICD-10-CM

## 2019-08-14 DIAGNOSIS — R1310 Dysphagia, unspecified: Secondary | ICD-10-CM | POA: Diagnosis not present

## 2019-08-14 DIAGNOSIS — K219 Gastro-esophageal reflux disease without esophagitis: Secondary | ICD-10-CM | POA: Diagnosis not present

## 2019-08-14 MED ORDER — PANTOPRAZOLE SODIUM 40 MG PO TBEC: 40.0000 mg | DELAYED_RELEASE_TABLET | Freq: Every day | ORAL | 2 refills | Status: DC

## 2019-08-14 NOTE — Progress Notes (Signed)
Primary Care Physician:  Darreld Mclean, MD Primary Gastroenterologist:  Dr. Gala Romney  Pre-Procedure History & Physical: HPI:  Ryan Dougherty is a 29 y.o. male here for follow-up of GERD regurgitation and esophageal dysphagia.  Seen last year.  Given Prilosec 40 mg daily which helped his symptoms significantly.  He ran out.  He states his dysphagia to solids and reflux symptoms have improved off of any medication.  He still has some alternating constipation and diarrhea mostly on the constipation side.  Mucus laiden bowel movements from time to time.  Had internal hernia repair last year.  Also, issues with left ear for which he is been seeing ENT.  Denies melena or rectal bleeding.  Denies nausea or vomiting.  Gallbladder ultrasound negative;  barium pill esophagram limited as patient aspirated water.  EGD recommended.  Scheduled x2.  Patient canceled the procedure on both occasions citing concerns about how he would paperwork.  Past Medical History:  Diagnosis Date  . GERD (gastroesophageal reflux disease)     Past Surgical History:  Procedure Laterality Date  . LAPAROSCOPY N/A 09/05/2018   Procedure: LAPAROSCOPY DIAGNOSTIC CONVERTED TO LAPAROTOMY (AT 0848) WITH REDUCTION OF INTERNAL HERNIA  AND CLOSURE OF MESENTERIC DEFECT;  Surgeon: Virl Cagey, MD;  Location: [facility];  Service: General;  Laterality: N/A;    Prior to Admission medications   Medication Sig Start Date End Date Taking? Authorizing Provider  LORazepam (ATIVAN) 1 MG tablet Take 1 tablet (1 mg total) by mouth every 8 (eight) hours as needed (esophageal spasm). Patient not taking: Reported on 08/14/2019 03/08/19   Evalee Jefferson, PA-C  omeprazole (PRILOSEC) 40 MG capsule Take 1 capsule (40 mg total) by mouth daily. Patient not taking: Reported on 03/08/2019 03/08/19   Carlis Stable, NP    Allergies as of 08/14/2019 - Review Complete 08/14/2019  Allergen Reaction Noted  . Penicillins Rash 01/06/2012  . Codeine Rash  01/06/2013  . Roxicodone [oxycodone hcl] Other (See Comments) 09/08/2018    Family History  Problem Relation Age of Onset  . Heart attack Maternal Grandfather 79  . Hypertension Maternal Grandfather   . Breast cancer Maternal Grandmother   . Arthritis Maternal Grandmother   . Colon cancer Neg Hx   . Gastric cancer Neg Hx   . Esophageal cancer Neg Hx     Social History   Socioeconomic History  . Marital status: Single    Spouse name: Not on file  . Number of children: Not on file  . Years of education: Not on file  . Highest education level: Not on file  Occupational History  . Not on file  Tobacco Use  . Smoking status: Never Smoker  . Smokeless tobacco: Never Used  Substance and Sexual Activity  . Alcohol use: Yes    Comment: rare  . Drug use: No  . Sexual activity: Not on file  Other Topics Concern  . Not on file  Social History Narrative  . Not on file   Social Determinants of Health   Financial Resource Strain:   . Difficulty of Paying Living Expenses:   Food Insecurity:   . Worried About Charity fundraiser in the Last Year:   . Arboriculturist in the Last Year:   Transportation Needs:   . Film/video editor (Medical):   Marland Kitchen Lack of Transportation (Non-Medical):   Physical Activity:   . Days of Exercise per Week:   . Minutes of Exercise per Session:  Stress:   . Feeling of Stress :   Social Connections:   . Frequency of Communication with Friends and Family:   . Frequency of Social Gatherings with Friends and Family:   . Attends Religious Services:   . Active Member of Clubs or Organizations:   . Attends Banker Meetings:   Marland Kitchen Marital Status:   Intimate Partner Violence:   . Fear of Current or Ex-Partner:   . Emotionally Abused:   Marland Kitchen Physically Abused:   . Sexually Abused:     Review of Systems: See HPI, otherwise negative ROS  Physical Exam: BP 125/67   Pulse 70   Temp (!) 97 F (36.1 C) (Oral)   Ht 5\' 10"  (1.778 m)   Wt  167 lb 9.6 oz (76 kg)   BMI 24.05 kg/m  General:   Alert,  Well-developed, well-nourished, pleasant and cooperative in NAD Neck:  Supple; no masses or thyromegaly. No significant cervical adenopathy. Lungs:  Clear throughout to auscultation.   No wheezes, crackles, or rhonchi. No acute distress. Heart:  Regular rate and rhythm; no murmurs, clicks, rubs,  or gallops. Abdomen: Non-distended, normal bowel sounds.  Soft and nontender without appreciable mass or hepatosplenomegaly.  Pulses:  Normal pulses noted. Extremities:  Without clubbing or edema.  Impression/Plan: 29 year old gentleman with what sounds like dyspepsia/reflux symptoms,  GERD and esophageal dysphagia over the past year.  He downplays his symptoms currently.  He still has some reflux equivalent symptoms.  States "things going better" lately than previously.  Does complain of bowel issues in passing which sound more like irritable bowel syndrome to me.  No alarm features.  Explained to patient he may have undiagnosed conditions.  EGD has been recommended previously; Not doing an EGD may delay diagnosis and management of a significant illness.  I think that he should be on acid suppression therapy empirically at this time.   Recommendations:  GERD information provided  Begin protonix 40 mg daily (disp 30 - with 2 refills)  As discussed, GI evaluation incomplete as EGD not yet done;  It continues to be recommended  Office visit in 3 months  If patient decides to have the EGD done, he is to let 37 know  Notice: This dictation was prepared with Dragon dictation along with smaller phrase technology. Any transcriptional errors that result from this process are unintentional and may not be corrected upon review.

## 2019-08-14 NOTE — Patient Instructions (Signed)
GERD information provided  Begin protonix 40 mg daily (disp 30 - with 2 refills)  As discussed, GI evaluation incomplete as EGD not yet done  It continues to be recommended  Office visit in 3 months  If you decide to have the EGD done, let us know

## 2019-11-13 ENCOUNTER — Ambulatory Visit: Payer: PRIVATE HEALTH INSURANCE | Admitting: Gastroenterology

## 2019-11-13 ENCOUNTER — Encounter: Payer: Self-pay | Admitting: Internal Medicine

## 2019-11-13 NOTE — Progress Notes (Deleted)
Primary Care Physician: Pearline Cables, MD  Primary Gastroenterologist:  Roetta Sessions, MD   No chief complaint on file.   HPI: Ryan Dougherty is a 29 y.o. male here for follow-up.  Last seen in April 2021 with history of GERD, esophageal dysphagia.  Previously given omeprazole 40 mg daily.  Helped his symptoms significantly.  Even off the medication he seemed to do well.  EGD recommended and scheduled twice but patient had canceled.  Advised again at last office visit but patient declined.  He was restarted on pantoprazole 40 mg daily.    Right upper quadrant ultrasound January 2021: Normal  Barium pill esophagram November 2020: Aspiration of water while swallowing the 12.5 mm barium tablet, resulted in prompt strong episode of coughing.  Otherwise exam normal.  CT abdomen pelvis with contrast May 2020: Findings suggestive closed-loop obstruction/internal hernia with dilated fluid-filled small bowel loops in the left abdomen with decompressed small bowel proximal to and distal to these loops.  Current Outpatient Medications  Medication Sig Dispense Refill  . LORazepam (ATIVAN) 1 MG tablet Take 1 tablet (1 mg total) by mouth every 8 (eight) hours as needed (esophageal spasm). (Patient not taking: Reported on 08/14/2019) 15 tablet 0  . omeprazole (PRILOSEC) 40 MG capsule Take 1 capsule (40 mg total) by mouth daily. (Patient not taking: Reported on 03/08/2019) 90 capsule 3  . pantoprazole (PROTONIX) 40 MG tablet Take 1 tablet (40 mg total) by mouth daily. 30 tablet 2   No current facility-administered medications for this visit.    Allergies as of 11/13/2019 - Review Complete 08/14/2019  Allergen Reaction Noted  . Penicillins Rash 01/06/2012  . Codeine Rash 01/06/2013  . Roxicodone [oxycodone hcl] Other (See Comments) 09/08/2018    ROS:  General: Negative for anorexia, weight loss, fever, chills, fatigue, weakness. ENT: Negative for hoarseness, difficulty swallowing  , nasal congestion. CV: Negative for chest pain, angina, palpitations, dyspnea on exertion, peripheral edema.  Respiratory: Negative for dyspnea at rest, dyspnea on exertion, cough, sputum, wheezing.  GI: See history of present illness. GU:  Negative for dysuria, hematuria, urinary incontinence, urinary frequency, nocturnal urination.  Endo: Negative for unusual weight change.    Physical Examination:   There were no vitals taken for this visit.  General: Well-nourished, well-developed in no acute distress.  Eyes: No icterus. Mouth: Oropharyngeal mucosa moist and pink , no lesions erythema or exudate. Lungs: Clear to auscultation bilaterally.  Heart: Regular rate and rhythm, no murmurs rubs or gallops.  Abdomen: Bowel sounds are normal, nontender, nondistended, no hepatosplenomegaly or masses, no abdominal bruits or hernia , no rebound or guarding.   Extremities: No lower extremity edema. No clubbing or deformities. Neuro: Alert and oriented x 4   Skin: Warm and dry, no jaundice.   Psych: Alert and cooperative, normal mood and affect.  Labs:  Lab Results  Component Value Date   CREATININE 0.90 03/08/2019   BUN 11 03/08/2019   NA 138 03/08/2019   K 3.9 03/08/2019   CL 104 03/08/2019   CO2 25 03/08/2019   Lab Results  Component Value Date   ALT 17 03/08/2019   AST 18 03/08/2019   ALKPHOS 70 03/08/2019   BILITOT 1.0 03/08/2019   Lab Results  Component Value Date   WBC 5.6 03/08/2019   HGB 15.2 03/08/2019   HCT 44.8 03/08/2019   MCV 87.5 03/08/2019   PLT 252 03/08/2019   Lab Results  Component Value Date   ESRSEDRATE  6 12/13/2018   POCTSEDRATE 8 09/08/2015   Lab Results  Component Value Date   CRP <1.0 12/13/2018   Lab Results  Component Value Date   HGBA1C 5.1 12/13/2018    Imaging Studies: No results found.

## 2019-12-05 NOTE — Patient Instructions (Signed)
Good to see you again today! I will be in touch with your labs asap We will check labs to see if celiac disease is likely  We will also try amitriptyline 25 mg at bedtime for anxiety/ depression symptoms. We can increase your dosage if well tolerated- please update me about how you are feeling in a few weeks. Sooner if any adverse side effects    Health Maintenance, Male Adopting a healthy lifestyle and getting preventive care are important in promoting health and wellness. Ask your health care provider about:  The right schedule for you to have regular tests and exams.  Things you can do on your own to prevent diseases and keep yourself healthy. What should I know about diet, weight, and exercise? Eat a healthy diet   Eat a diet that includes plenty of vegetables, fruits, low-fat dairy products, and lean protein.  Do not eat a lot of foods that are high in solid fats, added sugars, or sodium. Maintain a healthy weight Body mass index (BMI) is a measurement that can be used to identify possible weight problems. It estimates body fat based on height and weight. Your health care provider can help determine your BMI and help you achieve or maintain a healthy weight. Get regular exercise Get regular exercise. This is one of the most important things you can do for your health. Most adults should:  Exercise for at least 150 minutes each week. The exercise should increase your heart rate and make you sweat (moderate-intensity exercise).  Do strengthening exercises at least twice a week. This is in addition to the moderate-intensity exercise.  Spend less time sitting. Even light physical activity can be beneficial. Watch cholesterol and blood lipids Have your blood tested for lipids and cholesterol at 30 years of age, then have this test every 5 years. You may need to have your cholesterol levels checked more often if:  Your lipid or cholesterol levels are high.  You are older than 30  years of age.  You are at high risk for heart disease. What should I know about cancer screening? Many types of cancers can be detected early and may often be prevented. Depending on your health history and family history, you may need to have cancer screening at various ages. This may include screening for:  Colorectal cancer.  Prostate cancer.  Skin cancer.  Lung cancer. What should I know about heart disease, diabetes, and high blood pressure? Blood pressure and heart disease  High blood pressure causes heart disease and increases the risk of stroke. This is more likely to develop in people who have high blood pressure readings, are of African descent, or are overweight.  Talk with your health care provider about your target blood pressure readings.  Have your blood pressure checked: ? Every 3-5 years if you are 57-31 years of age. ? Every year if you are 54 years old or older.  If you are between the ages of 68 and 60 and are a current or former smoker, ask your health care provider if you should have a one-time screening for abdominal aortic aneurysm (AAA). Diabetes Have regular diabetes screenings. This checks your fasting blood sugar level. Have the screening done:  Once every three years after age 80 if you are at a normal weight and have a low risk for diabetes.  More often and at a younger age if you are overweight or have a high risk for diabetes. What should I know about preventing infection? Hepatitis  B If you have a higher risk for hepatitis B, you should be screened for this virus. Talk with your health care provider to find out if you are at risk for hepatitis B infection. Hepatitis C Blood testing is recommended for:  Everyone born from [birth year] through 1966.  Anyone with known risk factors for hepatitis C. Sexually transmitted infections (STIs)  You should be screened each year for STIs, including gonorrhea and chlamydia, if: ? You are sexually active and are  younger than 30 years of age. ? You are older than 30 years of age and your health care provider tells you that you are at risk for this type of infection. ? Your sexual activity has changed since you were last screened, and you are at increased risk for chlamydia or gonorrhea. Ask your health care provider if you are at risk.  Ask your health care provider about whether you are at high risk for HIV. Your health care provider may recommend a prescription medicine to help prevent HIV infection. If you choose to take medicine to prevent HIV, you should first get tested for HIV. You should then be tested every 3 months for as long as you are taking the medicine. Follow these instructions at home: Lifestyle  Do not use any products that contain nicotine or tobacco, such as cigarettes, e-cigarettes, and chewing tobacco. If you need help quitting, ask your health care provider.  Do not use street drugs.  Do not share needles.  Ask your health care provider for help if you need support or information about quitting drugs. Alcohol use  Do not drink alcohol if your health care provider tells you not to drink.  If you drink alcohol: ? Limit how much you have to 0-2 drinks a day. ? Be aware of how much alcohol is in your drink. In the [place] one drink equals one 12 oz bottle of beer (355 mL), one 5 oz glass of wine (148 mL), or one 1 oz glass of hard liquor (44 mL). General instructions  Schedule regular health, dental, and eye exams.  Stay current with your vaccines.  Tell your health care provider if: ? You often feel depressed. ? You have ever been abused or do not feel safe at home. Summary  Adopting a healthy lifestyle and getting preventive care are important in promoting health and wellness.  Follow your health care provider's instructions about healthy diet, exercising, and getting tested or screened for diseases.  Follow your health care provider's instructions on monitoring your  cholesterol and blood pressure. This information is not intended to replace advice given to you by your health care provider. Make sure you discuss any questions you have with your health care provider. Document Revised: 04/12/2018 Document Reviewed: 04/12/2018 Elsevier Patient Education  2020 [employer].

## 2019-12-05 NOTE — Progress Notes (Addendum)
[facility] [address], [address] [place], [place] [address] [phone]  Date:  12/06/2019   Name:  Ryan Dougherty   DOB:  1990-05-21   MRN:  [id number]  PCP:  Pearline Cables, MD    Chief Complaint: Annual Exam   History of Present Illness:  Ryan Dougherty is a 29 y.o. 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 Dysphagia 03/08/2019  . Internal hernia 09/05/2018  . Other complete intestinal obstruction (HCC)   . Palpitations 03/21/2013  . Fatigue 03/21/2013  . Dyspnea 03/21/2013    Past Medical History:  Diagnosis Date  . GERD (gastroesophageal reflux disease)     Past Surgical History:  Procedure Laterality Date  . LAPAROSCOPY N/A 09/05/2018   Procedure: LAPAROSCOPY DIAGNOSTIC CONVERTED TO LAPAROTOMY (AT 0848) WITH REDUCTION OF INTERNAL HERNIA  AND CLOSURE OF MESENTERIC DEFECT;  Surgeon: Lucretia Roers, MD;  Location: [facility];  Service: General;   Laterality: N/A;    Social History   Tobacco Use  . Smoking status: Never Smoker  . Smokeless tobacco: Never Used  Substance Use Topics  . Alcohol use: Yes    Comment: rare  . Drug use: No    Family History  Problem Relation Age of Onset  . Heart attack Maternal Grandfather 79  . Hypertension Maternal Grandfather   . Breast cancer Maternal Grandmother   . Arthritis Maternal Grandmother   . Colon cancer Neg Hx   . Gastric cancer Neg Hx   . Esophageal cancer Neg Hx     Allergies  Allergen Reactions  . Penicillins Rash    Did it involve swelling of the face/tongue/throat, SOB, or low BP? No Did it involve sudden or severe rash/hives, skin peeling, or any reaction on the inside of your mouth or nose? Yes Did you need to seek medical attention at a hospital or doctor's office? No When did it last happen? If all above answers are "NO", may proceed with cephalosporin use.   . Codeine Rash  . Roxicodone [Oxycodone Hcl] Other (See Comments)    Shortness of breath and chest tightness with some nausea    Medication list has been reviewed and updated.  No current outpatient medications on file prior to visit.   No current facility-administered medications on file prior to visit.    Review of  Systems:  As per HPI- otherwise negative. No acute  Physical Examination: Vitals:   12/06/19 1057  BP: 128/84  Pulse: 68  Resp: 17  SpO2: 98%   Vitals:   12/06/19 1057  Weight: 164 lb (74.4 kg)  Height: 5\' 10"  (1.778 m)   Body mass index is 23.53 kg/m. Ideal Body Weight: Weight in (lb) to have BMI = 25: 173.9  GEN: no acute distress.  Normal weight, looks well. Bilateral TM wnl, oropharynx normal.  PEERL,EOMI.   HEENT: Atraumatic, Normocephalic.  Ears and Nose: No external deformity. CV: RRR, No M/G/R. No JVD. No thrill. No extra heart sounds. PULM: CTA B, no wheezes, crackles, rhonchi. No retractions. No resp. distress. No accessory muscle use. ABD: S, NT, ND,  +BS. No rebound. No HSM.  Belly is benign EXTR: No c/c/e PSYCH: Normally interactive. Conversant.    Assessment and Plan: Physical exam  Screening for hyperlipidemia - Plan: Lipid panel  Screening for diabetes mellitus - Plan: Comprehensive metabolic panel, Hemoglobin A1c  Screening for deficiency anemia - Plan: CBC  Adjustment disorder, unspecified type - Plan: amitriptyline (ELAVIL) 25 MG tablet  Abdominal bloating with cramps - Plan: Gliadin antibodies, serum, Tissue transglutaminase, IgA, Reticulin Antibody, IgA w reflex titer, Lipase, Amylase  Here today for physical exam Labs pending as above Patient has had some difficulty with GI distress for the last couple of years.  About 15 months ago he had a laparotomy for an internal hernia repair.  He notes he continues to have nausea, reflux, abdominal bloating and discomfort, unusual stools. He does admit there is some anxiety component, he tends to read about his symptoms online and become afraid he could have some sort of cancer.  I advised him that he is not likely to have cancer, though he certainly could have something such as Crohn's disease or ulcerative colitis.  We will obtain labs as above, encouraged him to follow-up with GI if symptoms continue. He is interested in trying amitriptyline in particular for his anxiety symptoms, a friend of his has used it for GI symptoms with success.  I think it would be a reasonable idea, prescribed amitriptyline 25 mg.  He will let me know how this works for him He is not having suicidal ideation  Encouraged healthy diet, exercise Non-smoker This visit occurred during the SARS-CoV-2 public health emergency.  Safety protocols were in place, including screening questions prior to the visit, additional usage of staff PPE, and extensive cleaning of exam room while observing appropriate contact time as indicated for disinfecting solutions.    Signed , MD  Received his labs as  below, message to patient  Results for orders placed or performed in visit on 12/06/19  CBC  Result Value Ref Range   WBC 4.6 4.0 - 10.5 K/uL   RBC 4.82 4.22 - 5.81 Mil/uL   Platelets 283.0 150 - 400 K/uL   Hemoglobin 14.4 13.0 - 17.0 g/dL   HCT 02/05/20 39 - 52 %   MCV 86.7 78.0 - 100.0 fl   MCHC 34.5 30.0 - 36.0 g/dL   RDW 27.2 53.6 - 64.4 %  Comprehensive metabolic panel  Result Value Ref Range   Sodium 138 135 - 145 mEq/L   Potassium 4.6 3.5 - 5.1 mEq/L   Chloride 104 96 - 112 mEq/L   CO2 25 19 - 32 mEq/L   Glucose, Bld 99 70 - 99 mg/dL   BUN 14 6 - 23 mg/dL   Creatinine, Ser 03.4  0.40 - 1.50 mg/dL   Total Bilirubin 0.7 0.2 - 1.2 mg/dL   Alkaline Phosphatase 67 39 - 117 U/L   AST 26 0 - 37 U/L   ALT 21 0 - 53 U/L   Total Protein 7.4 6.0 - 8.3 g/dL   Albumin 5.0 3.5 - 5.2 g/dL   GFR 607.37 >10.62 mL/min   Calcium 9.8 8.4 - 10.5 mg/dL  Hemoglobin I9S  Result Value Ref Range   Hgb A1c MFr Bld 5.1 4.6 - 6.5 %  Lipid panel  Result Value Ref Range   Cholesterol 144 0 - 200 mg/dL   Triglycerides 85.4 0 - 149 mg/dL   HDL 62.70 >35.00 mg/dL   VLDL 93.8 0.0 - 18.2 mg/dL   LDL Cholesterol 74 0 - 99 mg/dL   Total CHOL/HDL Ratio 2    NonHDL 84.21   Lipase  Result Value Ref Range   Lipase 5.0 (L) 11 - 59 U/L  Amylase  Result Value Ref Range   Amylase 19 (L) 27 - 131 U/L   blood counts are normal metabolic profile, liver and kidney function normal your A1c is normal, no sign of diabetes cholesterol looks great your pancreatic enzymes-amylase and lipase-are actually slightly low.  Typically, we get concerned when these enzyme levels are high = pancreatitis.  It is also possible to have pancreatic insufficiency; this means the pancreas is not producing enough enzyme for normal digestion-this can lead to stomach upset, diarrhea and strange stools however, per my understanding pancreatic insufficiency is not typically diagnosed by low blood level of enzyme-it is more of a clinical  diagnosis based on symptoms  certainly I do not see any sign of cancer, but as we said it is possible that you have something more run-of-the-mill such as Crohn's disease or ulcerative colitis  try the amitriptyline, let me is helpful. However, if you continue to have symptoms I would also recommend that you follow-up with your GI doctor take care  addnd 8/7- pt would like to pursue a CT abd for persistent pain.  I will order for him

## 2019-12-06 ENCOUNTER — Encounter: Payer: Self-pay | Admitting: Family Medicine

## 2019-12-06 ENCOUNTER — Ambulatory Visit (INDEPENDENT_AMBULATORY_CARE_PROVIDER_SITE_OTHER): Payer: PRIVATE HEALTH INSURANCE | Admitting: Family Medicine

## 2019-12-06 ENCOUNTER — Other Ambulatory Visit: Payer: Self-pay

## 2019-12-06 VITALS — BP 128/84 | HR 68 | Resp 17 | Ht 70.0 in | Wt 164.0 lb

## 2019-12-06 DIAGNOSIS — Z13220 Encounter for screening for lipoid disorders: Secondary | ICD-10-CM | POA: Diagnosis not present

## 2019-12-06 DIAGNOSIS — Z131 Encounter for screening for diabetes mellitus: Secondary | ICD-10-CM

## 2019-12-06 DIAGNOSIS — R109 Unspecified abdominal pain: Secondary | ICD-10-CM | POA: Diagnosis not present

## 2019-12-06 DIAGNOSIS — R140 Abdominal distension (gaseous): Secondary | ICD-10-CM

## 2019-12-06 DIAGNOSIS — Z0000 Encounter for general adult medical examination without abnormal findings: Secondary | ICD-10-CM

## 2019-12-06 DIAGNOSIS — Z130 Encounter for screening for diseases of the blood and blood-forming organs and certain disorders involving the immune mechanism: Secondary | ICD-10-CM

## 2019-12-06 DIAGNOSIS — F4320 Adjustment disorder, unspecified: Secondary | ICD-10-CM

## 2019-12-06 LAB — CBC
HCT: 41.8 % (ref 39.0–52.0)
Hemoglobin: 14.4 g/dL (ref 13.0–17.0)
MCHC: 34.5 g/dL (ref 30.0–36.0)
MCV: 86.7 fl (ref 78.0–100.0)
Platelets: 283 10*3/uL (ref 150.0–400.0)
RBC: 4.82 Mil/uL (ref 4.22–5.81)
RDW: 13.3 % (ref 11.5–15.5)
WBC: 4.6 10*3/uL (ref 4.0–10.5)

## 2019-12-06 LAB — COMPREHENSIVE METABOLIC PANEL
ALT: 21 U/L (ref 0–53)
AST: 26 U/L (ref 0–37)
Albumin: 5 g/dL (ref 3.5–5.2)
Alkaline Phosphatase: 67 U/L (ref 39–117)
BUN: 14 mg/dL (ref 6–23)
CO2: 25 mEq/L (ref 19–32)
Calcium: 9.8 mg/dL (ref 8.4–10.5)
Chloride: 104 mEq/L (ref 96–112)
Creatinine, Ser: 0.88 mg/dL (ref 0.40–1.50)
GFR: 101.59 mL/min (ref >60.00)
Glucose, Bld: 99 mg/dL (ref 70–99)
Potassium: 4.6 mEq/L (ref 3.5–5.1)
Sodium: 138 mEq/L (ref 135–145)
Total Bilirubin: 0.7 mg/dL (ref 0.2–1.2)
Total Protein: 7.4 g/dL (ref 6.0–8.3)

## 2019-12-06 LAB — LIPID PANEL
Cholesterol: 144 mg/dL (ref 0–200)
HDL: 60.2 mg/dL (ref >39.00)
LDL Cholesterol: 74 mg/dL (ref 0–99)
NonHDL: 84.21
Total CHOL/HDL Ratio: 2
Triglycerides: 51 mg/dL (ref 0.0–149.0)
VLDL: 10.2 mg/dL (ref 0.0–40.0)

## 2019-12-06 LAB — LIPASE: Lipase: 5 U/L — ABNORMAL LOW (ref 11.0–59.0)

## 2019-12-06 LAB — AMYLASE: Amylase: 19 U/L — ABNORMAL LOW (ref 27–131)

## 2019-12-06 LAB — HEMOGLOBIN A1C: Hgb A1c MFr Bld: 5.1 % (ref 4.6–6.5)

## 2019-12-06 MED ORDER — AMITRIPTYLINE HCL 25 MG PO TABS: 25.0000 mg | ORAL_TABLET | Freq: Every day | ORAL | 5 refills | Status: AC

## 2019-12-08 NOTE — Addendum Note (Signed)
Addended by: Abbe Amsterdam C on: 12/08/2019 05:45 PM   Modules accepted: Orders

## 2019-12-11 LAB — GLIADIN ANTIBODIES, SERUM
Gliadin IgA: 3 Units
Gliadin IgG: <1 Units

## 2019-12-11 LAB — TISSUE TRANSGLUTAMINASE, IGA: (tTG) Ab, IgA: 1 U/mL

## 2019-12-11 LAB — RETICULIN ANTIBODIES, IGA W TITER: Reticulin IGA Screen: NEGATIVE

## 2019-12-12 NOTE — Progress Notes (Signed)
Ryan Dougherty, can you look in to this?

## 2019-12-17 ENCOUNTER — Encounter: Payer: Self-pay | Admitting: Family Medicine

## 2019-12-17 ENCOUNTER — Ambulatory Visit (HOSPITAL_BASED_OUTPATIENT_CLINIC_OR_DEPARTMENT_OTHER)
Admission: RE | Admit: 2019-12-17 | Discharge: 2019-12-17 | Disposition: A | Discharge status: Home / Self Care | Payer: No Typology Code available for payment source | Source: Ambulatory Visit | Attending: Family Medicine | Admitting: Family Medicine

## 2019-12-17 ENCOUNTER — Other Ambulatory Visit: Payer: Self-pay

## 2019-12-17 DIAGNOSIS — R109 Unspecified abdominal pain: Secondary | ICD-10-CM | POA: Insufficient documentation

## 2019-12-17 DIAGNOSIS — R140 Abdominal distension (gaseous): Secondary | ICD-10-CM | POA: Diagnosis present

## 2019-12-17 MED ORDER — IOHEXOL 300 MG/ML  SOLN
100.0000 mL | Freq: Once | INTRAMUSCULAR | Status: AC | PRN
  Administered 2019-12-17: 100 mL via INTRAVENOUS

## 2021-02-07 IMAGING — CT CT ABD-PELV W/ CM
2 of 4 series · 16 of 46 positions shown, 18 images · IV contrast (Omnipaque)
Comparison: 09/05/2018 from [HOSPITAL]

CLINICAL DATA: Left upper quadrant pain and pelvic pain for 7
months. Bloating.

EXAM:
CT ABDOMEN AND PELVIS WITH CONTRAST
TECHNIQUE: Multidetector CT imaging of the abdomen and pelvis was performed
using the standard protocol following bolus administration of
intravenous contrast.
CONTRAST:  100mL OMNIPAQUE IOHEXOL 300 MG/ML  SOLN

[Series 2: axial st · axial · 0.84mm/px · z∈[-653,-158]mm · 13 of 109 slices shown, 15 images]
[im 5/109  soft-tissue]
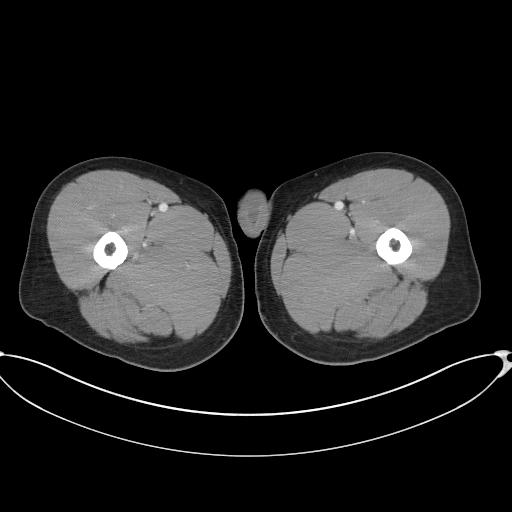
[im 5/109  bone]
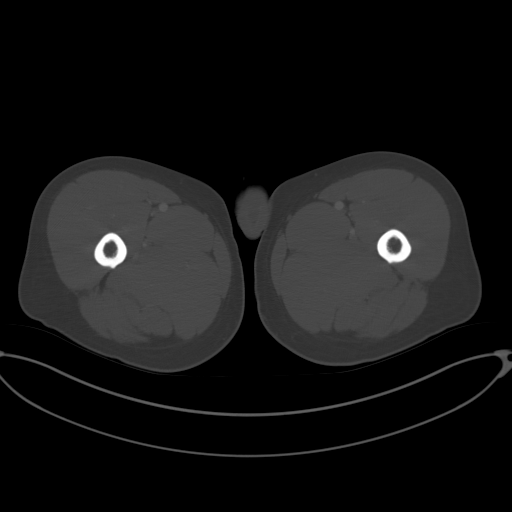
[im 13/109  soft-tissue]
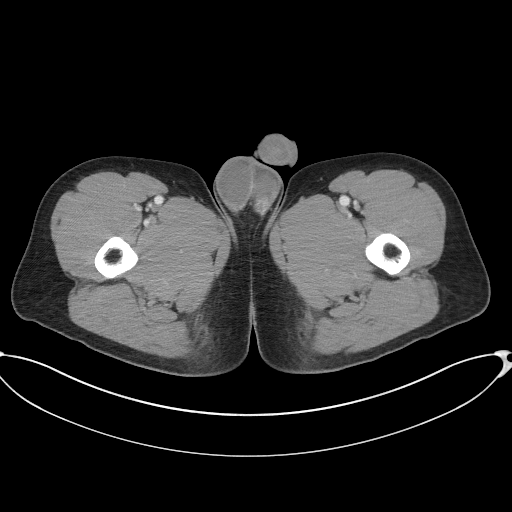
[im 22/109  soft-tissue]
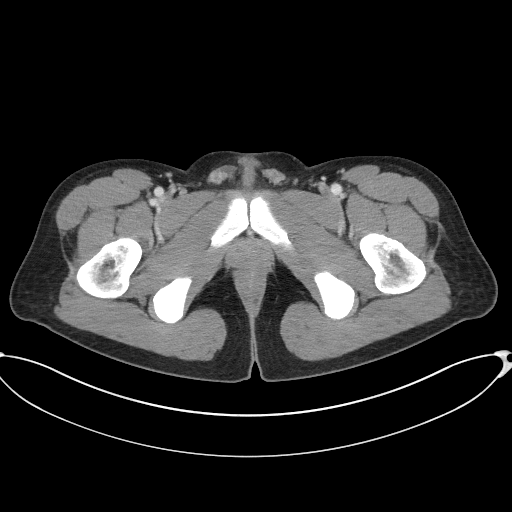
[im 31/109  soft-tissue]
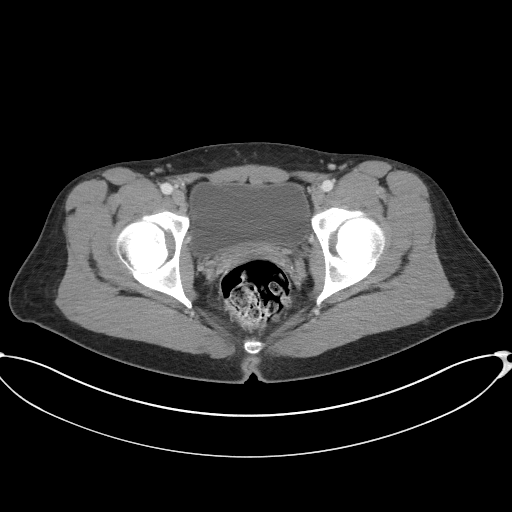
[im 39/109  soft-tissue]
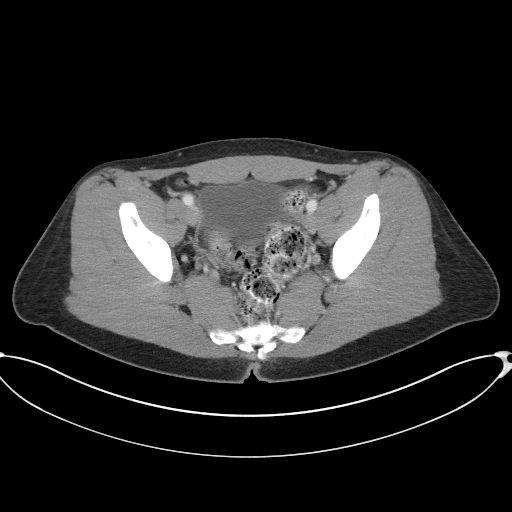
[im 48/109  soft-tissue]
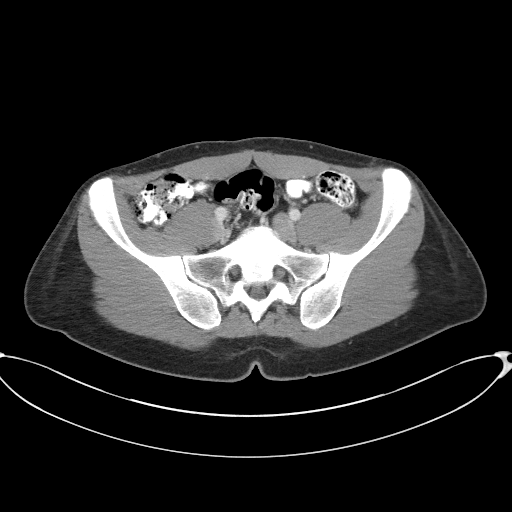
[im 57/109  soft-tissue]
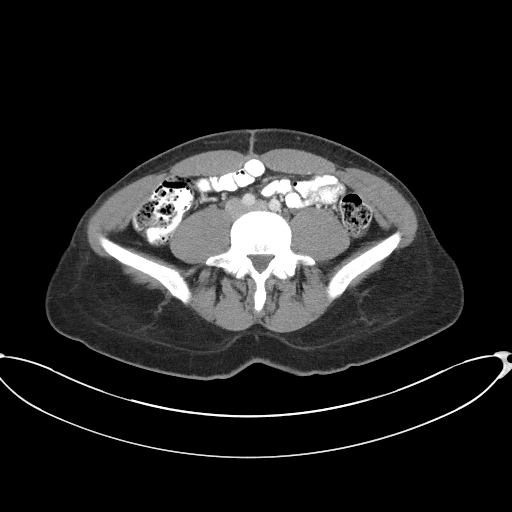
[im 61/109  soft-tissue]
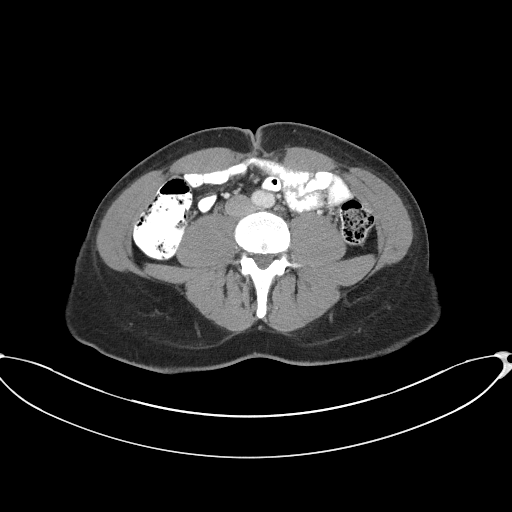
[im 70/109  soft-tissue]
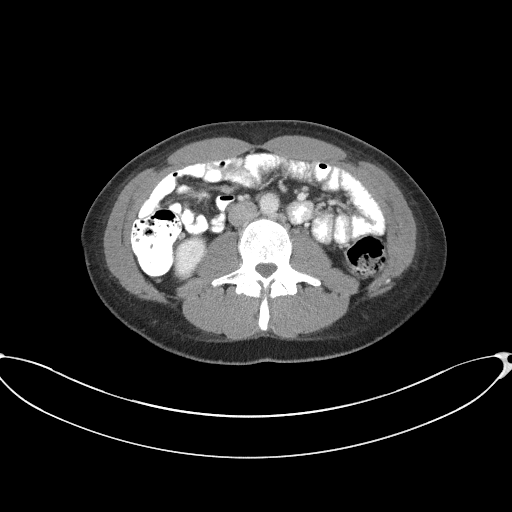
[im 70/109  bone]
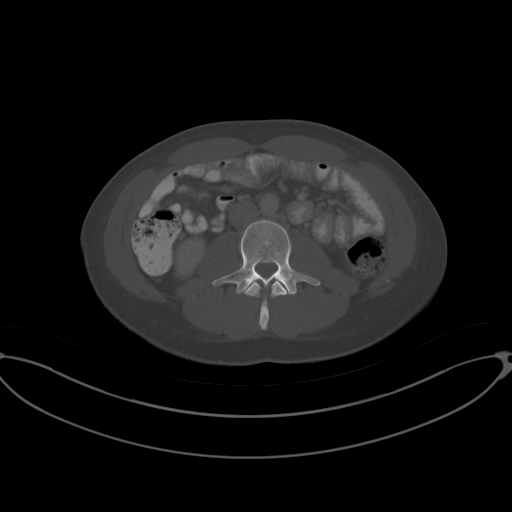
[im 78/109  soft-tissue]
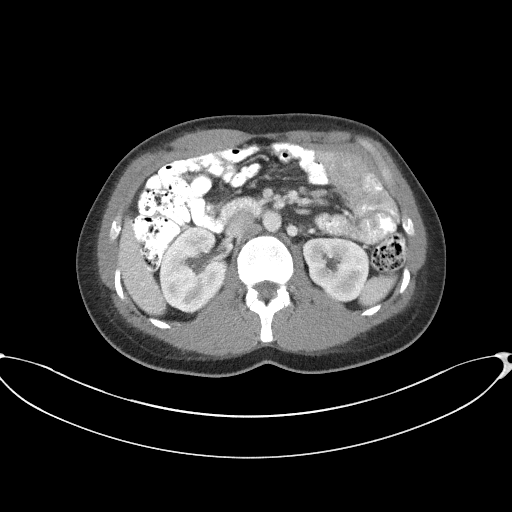
[im 87/109  soft-tissue]
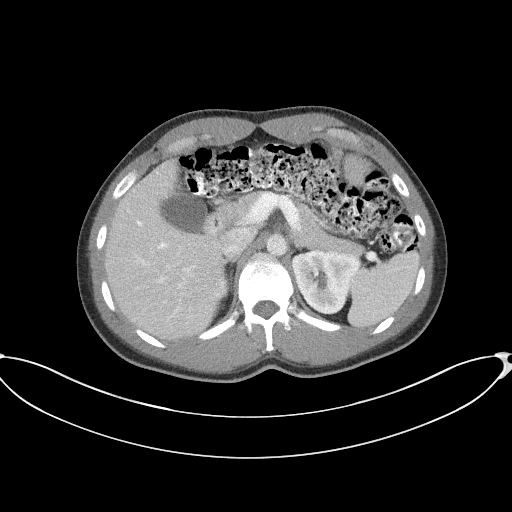
[im 96/109  soft-tissue]
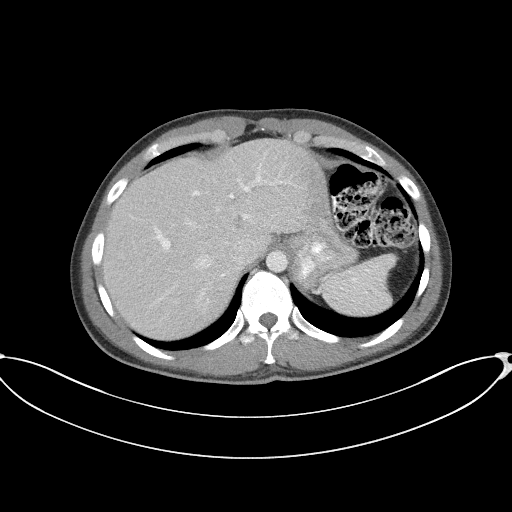
[im 104/109  soft-tissue]
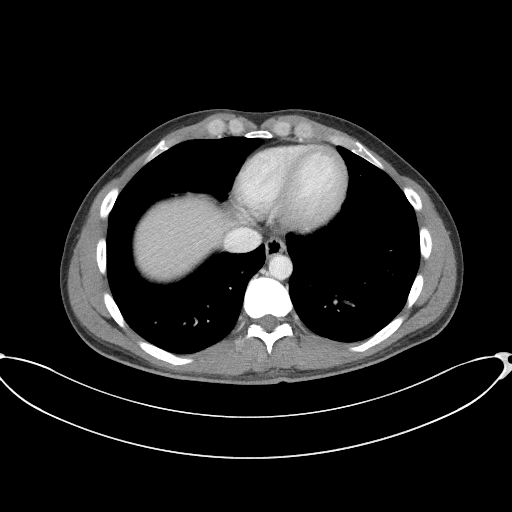

[Series 4: coronal st · coronal · 0.86mm/px · 3 of 72 slices shown]
[im 24/72  soft-tissue]
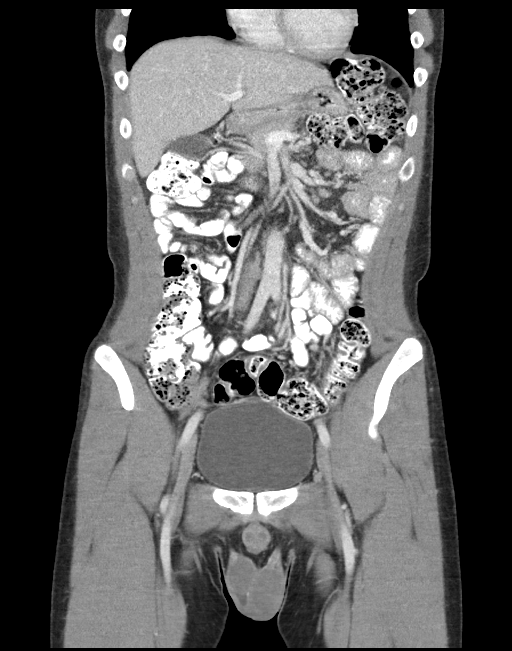
[im 32/72  soft-tissue]
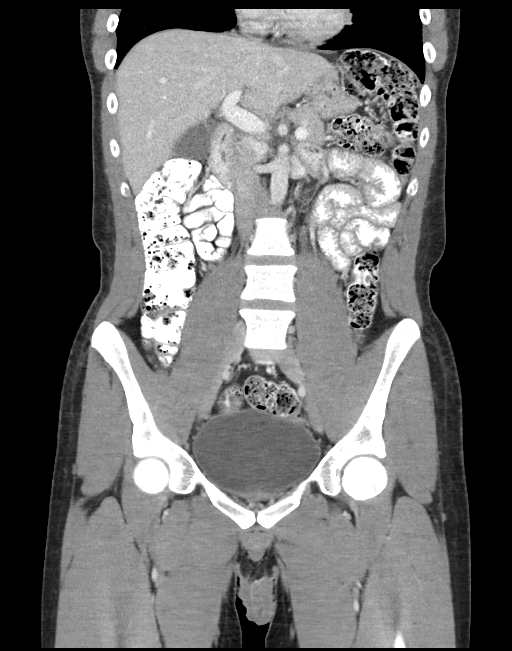
[im 40/72  soft-tissue]
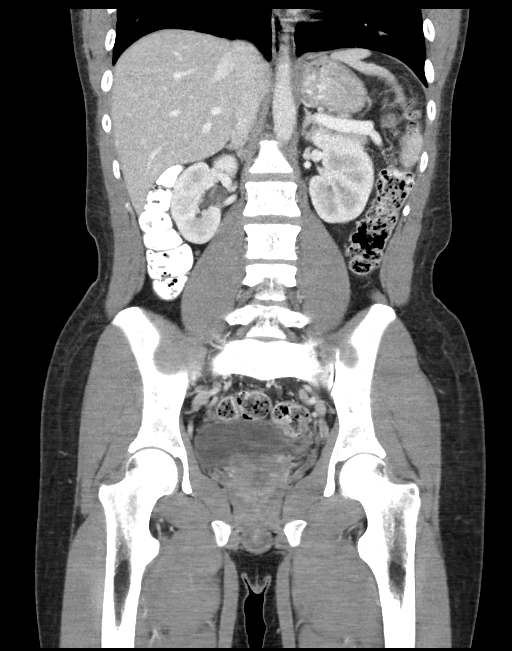

[16 of 46 positions shown; findings below may reference images not displayed]

FINDINGS: Lower Chest: No acute findings.

Hepatobiliary: No hepatic masses identified. Gallbladder is
unremarkable. No evidence of biliary ductal dilatation.

Pancreas:  No mass or inflammatory changes.

Spleen: Within normal limits in size and appearance.

Adrenals/Urinary Tract: No masses identified. No evidence of
ureteral calculi or hydronephrosis. Unremarkable unopacified urinary
bladder.

Stomach/Bowel: No evidence of obstruction, inflammatory process or
abnormal fluid collections. Normal appendix visualized. Large amount
of stool noted throughout the colon.

Vascular/Lymphatic: No pathologically enlarged lymph nodes. No
abdominal aortic aneurysm.

Reproductive:  No mass or other significant abnormality.

Other:  None.

Musculoskeletal:  No suspicious bone lesions identified.
IMPRESSION: No acute findings within the abdomen or pelvis.

Large stool burden noted; recommend clinical correlation for
possible constipation.

## 2022-04-19 ENCOUNTER — Emergency Department (HOSPITAL_COMMUNITY): Payer: PRIVATE HEALTH INSURANCE

## 2022-04-19 ENCOUNTER — Emergency Department (HOSPITAL_COMMUNITY)
Admission: EM | Admit: 2022-04-19 | Discharge: 2022-04-19 | Disposition: A | Discharge status: Home / Self Care | Payer: PRIVATE HEALTH INSURANCE | Attending: Emergency Medicine | Admitting: Emergency Medicine

## 2022-04-19 ENCOUNTER — Encounter (HOSPITAL_COMMUNITY): Payer: Self-pay

## 2022-04-19 DIAGNOSIS — R109 Unspecified abdominal pain: Secondary | ICD-10-CM | POA: Diagnosis present

## 2022-04-19 DIAGNOSIS — K219 Gastro-esophageal reflux disease without esophagitis: Secondary | ICD-10-CM | POA: Diagnosis not present

## 2022-04-19 DIAGNOSIS — K529 Noninfective gastroenteritis and colitis, unspecified: Secondary | ICD-10-CM | POA: Diagnosis not present

## 2022-04-19 LAB — CBC
HCT: 45.6 % (ref 39.0–52.0)
Hemoglobin: 15.8 g/dL (ref 13.0–17.0)
MCH: 29.4 pg (ref 26.0–34.0)
MCHC: 34.6 g/dL (ref 30.0–36.0)
MCV: 84.8 fL (ref 80.0–100.0)
Platelets: 298 10*3/uL (ref 150–400)
RBC: 5.38 MIL/uL (ref 4.22–5.81)
RDW: 13 % (ref 11.5–15.5)
WBC: 7.1 10*3/uL (ref 4.0–10.5)
nRBC: 0 % (ref 0.0–0.2)

## 2022-04-19 LAB — URINALYSIS, ROUTINE W REFLEX MICROSCOPIC
Bilirubin Urine: NEGATIVE
Glucose, UA: NEGATIVE mg/dL
Hgb urine dipstick: NEGATIVE
Ketones, ur: 20 mg/dL — AB
Leukocytes,Ua: NEGATIVE
Nitrite: NEGATIVE
Protein, ur: NEGATIVE mg/dL
Specific Gravity, Urine: >1.046 — ABNORMAL HIGH (ref 1.005–1.030)
pH: 6 (ref 5.0–8.0)

## 2022-04-19 LAB — LIPASE, BLOOD: Lipase: 25 U/L (ref 11–51)

## 2022-04-19 LAB — COMPREHENSIVE METABOLIC PANEL
ALT: 25 U/L (ref 0–44)
AST: 22 U/L (ref 15–41)
Albumin: 4.9 g/dL (ref 3.5–5.0)
Alkaline Phosphatase: 80 U/L (ref 38–126)
Anion gap: 12 (ref 5–15)
BUN: 14 mg/dL (ref 6–20)
CO2: 25 mmol/L (ref 22–32)
Calcium: 10.1 mg/dL (ref 8.9–10.3)
Chloride: 100 mmol/L (ref 98–111)
Creatinine, Ser: 1.11 mg/dL (ref 0.61–1.24)
GFR, Estimated: >60 mL/min (ref >60)
Glucose, Bld: 105 mg/dL — ABNORMAL HIGH (ref 70–99)
Potassium: 4.9 mmol/L (ref 3.5–5.1)
Sodium: 137 mmol/L (ref 135–145)
Total Bilirubin: 1.4 mg/dL — ABNORMAL HIGH (ref 0.3–1.2)
Total Protein: 8 g/dL (ref 6.5–8.1)

## 2022-04-19 LAB — DIFFERENTIAL
Abs Immature Granulocytes: 0.02 10*3/uL (ref 0.00–0.07)
Basophils Absolute: 0 10*3/uL (ref 0.0–0.1)
Basophils Relative: 0 %
Eosinophils Absolute: 0 10*3/uL (ref 0.0–0.5)
Eosinophils Relative: 0 %
Immature Granulocytes: 0 %
Lymphocytes Relative: 12 %
Lymphs Abs: 0.8 10*3/uL (ref 0.7–4.0)
Monocytes Absolute: 0.4 10*3/uL (ref 0.1–1.0)
Monocytes Relative: 5 %
Neutro Abs: 6 10*3/uL (ref 1.7–7.7)
Neutrophils Relative %: 83 %

## 2022-04-19 MED ORDER — IOHEXOL 300 MG/ML  SOLN
100.0000 mL | Freq: Once | INTRAMUSCULAR | Status: AC | PRN
  Administered 2022-04-19: 100 mL via INTRAVENOUS

## 2022-04-19 MED ORDER — SODIUM CHLORIDE 0.9 % IV BOLUS
1000.0000 mL | Freq: Once | INTRAVENOUS | Status: AC
  Administered 2022-04-19: 1000 mL via INTRAVENOUS

## 2022-04-19 MED ORDER — ONDANSETRON HCL 4 MG PO TABS: 4.0000 mg | ORAL_TABLET | Freq: Four times a day (QID) | ORAL | 0 refills | Status: AC | PRN

## 2022-04-19 NOTE — Discharge Instructions (Addendum)
Note the workup today was overall reassuring.  As discussed, recommend bland diet as well as slowly introducing more solid foods as tolerable.  Recommend taking Tylenol for discomfort.  Recommend follow-up with primary care for reevaluation of symptoms in 3 to 5 days.  Please not hesitate to return to emergency department for worrisome signs and symptoms we discussed become apparent.

## 2022-04-19 NOTE — ED Notes (Signed)
Urinal given to patient.

## 2022-04-19 NOTE — ED Provider Triage Note (Signed)
Emergency Medicine Provider Triage Evaluation Note  Ryan Dougherty , a 31 y.o. male  was evaluated in triage.  Pt complains of abdominal pain.  Patient notes that symptoms began insidiously approximately 2 days ago.  Reports he has eaten nothing since onset due to increasing pain.  History of bowel obstruction with surgical intervention.  Denies fever, chills, night sweats.  Last bowel movement yesterday..  Review of Systems  Positive: See above Negative:   Physical Exam  BP (!) 138/91 (BP Location: Right Arm)   Pulse 94   Temp 98.2 F (36.8 C) (Oral)   Resp 19   Ht 5\' 10"  (1.778 m)   Wt 77.1 kg   SpO2 99%   BMI 24.39 kg/m  Gen:   Awake, no distress see abov Resp:  Normal effort  MSK:   Moves extremities without difficulty  Other:  Abdominal tenderness  Medical Decision Making  Medically screening exam initiated at 1:08 PM.  Appropriate orders placed.  Ryan Dougherty was informed that the remainder of the evaluation will be completed by another provider, this initial triage assessment does not replace that evaluation, and the importance of remaining in the ED until their evaluation is complete.     Benita Stabile, Peter Garter 04/19/22 1309

## 2022-04-19 NOTE — ED Triage Notes (Signed)
Pt c/o periumbilical pain with N/V since Saturday morning.

## 2022-04-19 NOTE — ED Provider Notes (Signed)
[facility] EMERGENCY DEPARTMENT Provider Note   CSN: [id number] Arrival date & time: 04/19/22  1205     History  Chief Complaint  Patient presents with   Abdominal Pain    Ryan Dougherty is a 31 y.o. male.   Abdominal Pain   31 year old male presents emergency department with complaints of abdominal pain.  Patient notes that symptoms began insidiously approximately 2 days ago.  Reports he has eaten nothing since onset due to increasing pain and associated feeling of some nausea.  History of bowel obstruction with surgical intervention.  Denies fever, chills, night sweats.  Last bowel movement yesterday.  Denies chest pain, shortness of breath, urinary symptoms, change in bowel habits, hematochezia/melena.  Past medical history significant for GERD, complete intestinal obstruction  Home Medications Prior to Admission medications   Medication Sig Start Date End Date Taking? Authorizing Provider  ondansetron (ZOFRAN) 4 MG tablet Take 1 tablet (4 mg total) by mouth every 6 (six) hours as needed for nausea or vomiting. 04/19/22  Yes Sherian Maroon A, PA  amitriptyline (ELAVIL) 25 MG tablet Take 1 tablet (25 mg total) by mouth at bedtime. 12/06/19   Copland, Gwenlyn Found, MD      Allergies    Penicillins, Codeine, and Roxicodone [oxycodone hcl]    Review of Systems   Review of Systems  Gastrointestinal:  Positive for abdominal pain.  All other systems reviewed and are negative.   Physical Exam Updated Vital Signs BP 125/70 (BP Location: Left Arm)   Pulse 61   Temp 98.4 F (36.9 C) (Oral)   Resp 18   Ht 5\' 10"  (1.778 m)   Wt 77.1 kg   SpO2 99%   BMI 24.39 kg/m  Physical Exam Vitals and nursing note reviewed.  Constitutional:      General: He is not in acute distress.    Appearance: He is well-developed.  HENT:     Head: Normocephalic and atraumatic.  Eyes:     Conjunctiva/sclera: Conjunctivae normal.  Cardiovascular:     Rate and Rhythm: Normal rate and regular  rhythm.     Heart sounds: No murmur heard. Pulmonary:     Effort: Pulmonary effort is normal. No respiratory distress.     Breath sounds: Normal breath sounds.  Abdominal:     Palpations: Abdomen is soft.     Tenderness: There is abdominal tenderness. There is no right CVA tenderness or left CVA tenderness.  Musculoskeletal:        General: No swelling.     Cervical back: Neck supple.  Skin:    General: Skin is warm and dry.     Capillary Refill: Capillary refill takes less than 2 seconds.  Neurological:     Mental Status: He is alert.  Psychiatric:        Mood and Affect: Mood normal.     ED Results / Procedures / Treatments   Labs (all labs ordered are listed, but only abnormal results are displayed) Labs Reviewed  COMPREHENSIVE METABOLIC PANEL - Abnormal; Notable for the following components:      Result Value   Glucose, Bld 105 (*)    Total Bilirubin 1.4 (*)    All other components within normal limits  URINALYSIS, ROUTINE W REFLEX MICROSCOPIC - Abnormal; Notable for the following components:   Color, Urine STRAW (*)    Specific Gravity, Urine >1.046 (*)    Ketones, ur 20 (*)    All other components within normal limits  LIPASE, BLOOD  CBC  DIFFERENTIAL    EKG None  Radiology CT Abdomen Pelvis W Contrast  Result Date: 04/19/2022 CLINICAL DATA:  Three days of periumbilical abdominal pain with nausea. History of bowel obstruction with surgical intervention. EXAM: CT ABDOMEN AND PELVIS WITH CONTRAST TECHNIQUE: Multidetector CT imaging of the abdomen and pelvis was performed using the standard protocol following bolus administration of intravenous contrast. RADIATION DOSE REDUCTION: This exam was performed according to the departmental dose-optimization program which includes automated exposure control, adjustment of the mA and/or kV according to patient size and/or use of iterative reconstruction technique. CONTRAST:  OMNIPAQUE IOHEXOL 300 MG/ML  SOLN COMPARISON:   CT December 17, 2019. FINDINGS: Lower chest: No acute abnormality. Hepatobiliary: No suspicious hepatic lesion. Gallbladder is unremarkable. No biliary ductal dilation. Pancreas: No pancreatic ductal dilation or evidence of acute inflammation. Spleen: No splenomegaly or focal splenic lesion. Adrenals/Urinary Tract: Bilateral adrenal glands are within normal limits. No hydronephrosis. Kidneys demonstrate symmetric enhancement. Urinary bladder is unremarkable for degree of distension. Stomach/Bowel: No radiopaque enteric contrast material was administered. Stomach is minimally distended limiting evaluation. Mild dilation of a short segment loop of small bowel in the left hemiabdomen measuring 3.7 cm in diameter with additional prominent loops of small bowel in the left hemiabdomen and symmetric mild wall thickening of loops of small bowel. There are intervening loops of nondilated non prominent bowel within these loops. No colonic wall thickening or masslike lesions identified. Normal appendix. Vascular/Lymphatic: Normal caliber abdominal aorta. No pathologically enlarged abdominal or pelvic lymph nodes. Reproductive: Uterus is surgically absent. No significant abdominopelvic free fluid. Other: Trace abdominopelvic free fluid. Musculoskeletal: No acute osseous abnormality. IMPRESSION: 1. Wall thickening of mildly dilated and prominent loops of bowel in the left hemiabdomen with intervening loops non dilated/prominent bowel, findings are most compatible with an infectious or inflammatory enteritis. 2. Mesenteric edema with trace abdominopelvic free fluid, likely reactive. Electronically Signed   By: Maudry Mayhew M.D.   On: 04/19/2022 15:10    Procedures Procedures    Medications Ordered in ED Medications  sodium chloride 0.9 % bolus 1,000 mL (0 mLs Intravenous Stopped 04/19/22 1528)  iohexol (OMNIPAQUE) 300 MG/ML solution 100 mL (100 mLs Intravenous Contrast Given 04/19/22 1454)    ED Course/ Medical  Decision Making/ A&P Clinical Course as of 04/19/22 1611  Mon Apr 19, 2022  1559 CT Abdomen Pelvis W Contrast [CR]    Clinical Course User Index [CR] Peter Garter, PA                           Medical Decision Making Amount and/or Complexity of Data Reviewed Labs: ordered. Radiology: ordered.  Risk Prescription drug management.   This patient presents to the ED for concern of abdominal pain, this involves an extensive number of treatment options, and is a complaint that carries with it a high risk of complications and morbidity.  The differential diagnosis includes SBO/LBO, nephrolithiasis, diverticulitis, appendicitis, CBD pathology, cholecystitis, volvulus infection, pyelonephritis   Co morbidities that complicate the patient evaluation  See HPI   Additional history obtained:  Additional history obtained from EMR External records from outside source obtained and reviewed including hospital records   Lab Tests:  I Ordered, and personally interpreted labs.  The pertinent results include: No leukocytosis noted.  No evidence anemia.  Platelets within normal range.  No electrolyte abnormalities noted.  No renal dysfunction.  No transaminitis appreciated.  Lipase within normal limits.  UA significant for  20 ketones but otherwise unremarkable.   Imaging Studies ordered:  I ordered imaging studies including CT abdomen pelvis I independently visualized and interpreted imaging which showed wall thickening of mildly dilated and prominent loops of bowel in left hemiabdomen with intervening loops of nondilated/prominent bowel.  Mesenteric edema with trace abdominal pelvic free fluid likely reactive. I agree with the radiologist interpretation  Cardiac Monitoring: / EKG:  The patient was maintained on a cardiac monitor.  I personally viewed and interpreted the cardiac monitored which showed an underlying rhythm of: Sinus rhythm   Consultations Obtained:  N/a   Problem  List / ED Course / Critical interventions / Medication management  Enteritis I ordered medication including 1 L normal saline for rehydration   Reevaluation of the patient after these medicines showed that the patient improved I have reviewed the patients home medicines and have made adjustments as needed   Social Determinants of Health:  Denies tobacco, illicit drug use   Test / Admission - Considered:  Enteritis Vitals signs within normal range and stable throughout visit. Laboratory/imaging studies significant for: See above Patient's symptoms likely secondary to viral enteritis given CT imaging findings.  Patient recommended bland diet as well as given antiemetics/antinausea medicine to take as needed.  No antibiotics warranted at this time given decreased concern for bacterial etiology.  Patient recommended follow-up with primary care for reevaluation in 3 to 5 days.  Treatment plan discussed with patient and he acknowledged understanding was agreeable to said plan. Worrisome signs and symptoms were discussed with the patient, and the patient acknowledged understanding to return to the ED if noticed. Patient was stable upon discharge.          Final Clinical Impression(s) / ED Diagnoses Final diagnoses:  Enteritis    Rx / DC Orders ED Discharge Orders          Ordered    ondansetron (ZOFRAN) 4 MG tablet  Every 6 hours PRN        04/19/22 1610              Peter Garter, [place] 04/19/22 1611    Sloan Leiter, DO 04/23/22 [phone]
